# Patient Record
Sex: Male | Born: 1980 | Race: White | Hispanic: No | Marital: Single | Smoking: Current every day smoker
Health system: Southern US, Community
[De-identification: ages and names within clinical notes are randomized; demographics above are authoritative.]

## PROBLEM LIST (undated history)

## (undated) DIAGNOSIS — F1010 Alcohol abuse, uncomplicated: Secondary | ICD-10-CM

---

## 2002-07-10 ENCOUNTER — Ambulatory Visit (HOSPITAL_COMMUNITY): Admission: RE | Admit: 2002-07-10 | Discharge: 2002-07-10 | Payer: Self-pay | Admitting: Family Medicine

## 2002-07-10 ENCOUNTER — Encounter: Payer: Self-pay | Admitting: Family Medicine

## 2013-09-10 ENCOUNTER — Encounter (HOSPITAL_COMMUNITY): Payer: Self-pay | Admitting: Emergency Medicine

## 2013-09-10 ENCOUNTER — Emergency Department (HOSPITAL_COMMUNITY)
Admission: EM | Admit: 2013-09-10 | Discharge: 2013-09-10 | Disposition: A | Discharge status: Home / Self Care | Payer: No Typology Code available for payment source | Attending: Emergency Medicine | Admitting: Emergency Medicine

## 2013-09-10 DIAGNOSIS — IMO0002 Reserved for concepts with insufficient information to code with codable children: Secondary | ICD-10-CM | POA: Insufficient documentation

## 2013-09-10 DIAGNOSIS — Y929 Unspecified place or not applicable: Secondary | ICD-10-CM | POA: Insufficient documentation

## 2013-09-10 DIAGNOSIS — F17200 Nicotine dependence, unspecified, uncomplicated: Secondary | ICD-10-CM | POA: Insufficient documentation

## 2013-09-10 DIAGNOSIS — Z791 Long term (current) use of non-steroidal anti-inflammatories (NSAID): Secondary | ICD-10-CM | POA: Insufficient documentation

## 2013-09-10 DIAGNOSIS — S058X9A Other injuries of unspecified eye and orbit, initial encounter: Secondary | ICD-10-CM | POA: Insufficient documentation

## 2013-09-10 DIAGNOSIS — Y9389 Activity, other specified: Secondary | ICD-10-CM | POA: Insufficient documentation

## 2013-09-10 DIAGNOSIS — S0501XA Injury of conjunctiva and corneal abrasion without foreign body, right eye, initial encounter: Secondary | ICD-10-CM

## 2013-09-10 MED ORDER — TETRACAINE HCL 0.5 % OP SOLN
2.0000 [drp] | Freq: Once | OPHTHALMIC | Status: AC
  Administered 2013-09-10: 2 [drp] via OPHTHALMIC
  Filled 2013-09-10: qty 2

## 2013-09-10 MED ORDER — HYDROCODONE-ACETAMINOPHEN 5-325 MG PO TABS: 1.0000 | ORAL_TABLET | ORAL | Status: DC | PRN

## 2013-09-10 MED ORDER — ERYTHROMYCIN 5 MG/GM OP OINT
TOPICAL_OINTMENT | Freq: Once | OPHTHALMIC | Status: AC
  Administered 2013-09-10: 16:00:00 via OPHTHALMIC

## 2013-09-10 MED ORDER — FLUORESCEIN SODIUM 1 MG OP STRP
1.0000 | ORAL_STRIP | Freq: Once | OPHTHALMIC | Status: AC
  Administered 2013-09-10: 1 via OPHTHALMIC
  Filled 2013-09-10: qty 1

## 2013-09-10 NOTE — ED Notes (Signed)
Per pt, working with wood saw.  Feels as if wood splinter has scratched eye.

## 2013-09-10 NOTE — ED Provider Notes (Signed)
CSN: [id number]     Arrival date & time 09/10/13  1518 History   First MD Initiated Contact with Patient 09/10/13 1545     Chief Complaint  Patient presents with  . Eye Pain    HPI Comments: Pt was working with a wood saw without using eye protection.  He felt like something kicked up an hit him in the eye.  He still feels like there is a piece of debris.  Opening his eye causes him to tear more and cause more pain.  His vision is blurry with all of the tears.  Patient is a 33 y.o. male presenting with eye pain. The history is provided by the patient.  Eye Pain This is a new problem. Episode onset: a short time ago today. The problem occurs constantly. The problem has not changed since onset.Nothing aggravates the symptoms. Relieved by: light, opening eye. He has tried nothing for the symptoms.    History reviewed. No pertinent past medical history. History reviewed. No pertinent past surgical history. History reviewed. No pertinent family history. History  Substance Use Topics  . Smoking status: Current Every Day Smoker -- 0.50 packs/day    Types: Cigarettes  . Smokeless tobacco: Not on file  . Alcohol Use: Yes     Comment: social    Review of Systems  Eyes: Positive for pain.  All other systems reviewed and are negative.    Allergies  Review of patient's allergies indicates no known allergies.  Home Medications   Current Outpatient Rx  Name  Route  Sig  Dispense  Refill  . naproxen sodium (ANAPROX) 220 MG tablet   Oral   Take 2 mg by mouth once.         Marland Kitchen HYDROcodone-acetaminophen (NORCO/VICODIN) 5-325 MG per tablet   Oral   Take 1-2 tablets by mouth every 4 (four) hours as needed.   16 tablet   0    BP 138/86  Pulse 66  Temp(Src) 98.8 F (37.1 C) (Oral)  Resp 18  SpO2 100% Physical Exam  Nursing note and vitals reviewed. Constitutional: He appears well-developed and well-nourished. No distress.  HENT:  Head: Normocephalic and atraumatic.  Right Ear:  External ear normal.  Left Ear: External ear normal.  Eyes: EOM are normal. Pupils are equal, round, and reactive to light. Right eye exhibits no discharge. Left eye exhibits no discharge. Right conjunctiva is injected. Left conjunctiva is not injected. No scleral icterus.  Slit lamp exam:      The right eye shows corneal abrasion and fluorescein uptake. The right eye shows no corneal flare, no corneal ulcer, no foreign body and no anterior chamber bulge.       The left eye shows no corneal abrasion, no corneal flare, no corneal ulcer, no foreign body, no fluorescein uptake and no anterior chamber bulge.    Eyelid everted, tetracaine used for patient comfort during exam, fluoroscein uptake depicted in blue  Neck: Neck supple. No tracheal deviation present.  Cardiovascular: Normal rate.   Pulmonary/Chest: Effort normal. No stridor. No respiratory distress.  Musculoskeletal: He exhibits no edema.  Neurological: He is alert. Cranial nerve deficit: no gross deficits.  Skin: Skin is warm and dry. No rash noted.  Psychiatric: He has a normal mood and affect.    ED Course  Procedures (including critical care time) Eye irrigation 1 l ns   MDM   1. Corneal abrasion, right, initial encounter     No foreign body noted on exam.  Will irrigate eye in the ED copiously.  Follow up with ophthalmology if symptoms aren't resolved within 24 hours    Celene KrasJon R Trenita Hulme, MD 09/10/13 [phone]

## 2013-09-10 NOTE — Discharge Instructions (Signed)
Corneal Abrasion  The cornea is the clear covering at the front and center of the eye. When looking at the colored portion (iris) of the eye, you are looking through that person's cornea.   This very thin tissue is made up of many layers. The surface layer is a single layer of cells called the corneal epithelium. This is one of the most sensitive tissues in the body. If a scratch or injury causes the corneal epithelium to come off, it is called a corneal abrasion. If the injury extends to the tissues below the epithelium, the condition is called a corneal ulcer.   CAUSES   · Scratches.  · Trauma.  · Foreign body in the eye.  · Some people have recurrences of abrasions in the area of the original injury even after they heal. This is called recurrent erosion syndrome. Recurrent erosion syndromes generally improve and go away with time.  SYMPTOMS   · Eye pain.  · Difficulty or inability to keep the injured eye open.  · The eye becomes very sensitive to light.  · Recurrent erosions tend to happen suddenly, first thing in the morning  usually upon awakening and opening the eyes.  DIAGNOSIS   Your eye professional can diagnose a corneal abrasion during an eye exam. Dye is usually placed in the eye using a drop or a small paper strip moistened by the patient's tears. When the eye is examined with a special light, the abrasion shows up clearly because of the dye.  TREATMENT   · Small abrasions may be treated with antibiotic drops or ointment alone.  · Usually a pressure patch is specially applied. Pressure patches prevent the eye from blinking, allowing the corneal epithelium to heal. Because blinking is less, a pressure patch also reduces the amount of pain present in the eye during healing. Most corneal abrasions heal within 2-3 days with no effect on vision. WARNING: Do not drive or operate machinery while your eye is patched. Your ability to judge distances is impaired.  · If abrasion becomes infected and spreads to the  deeper tissues of the cornea, a corneal ulcer can result. This is serious because it can cause corneal scarring. Corneal scars interfere with light passing through the cornea, and cause a loss of vision in the involved eye.  · If your caregiver has given you a follow-up appointment, it is very important to keep that appointment. Not keeping the appointment could result in a severe eye infection or permanent loss of vision. If there is any problem keeping the appointment, you must call back to this facility for assistance.  SEEK MEDICAL CARE IF:   · You have pain, light sensitivity and a scratchy feeling in one eye (or both).  · Your pressure patch keeps loosening up and you can blink your eye under the patch after treatment.  · Any kind of discharge develops from the involved eye after treatment or if the lids stick together in the morning.  · You have the same symptoms in the morning as you did with the original abrasion days, weeks or months after the abrasion healed.  MAKE SURE YOU:   · Understand these instructions.  · Will watch your condition.  · Will get help right away if you are not doing well or get worse.  Document Released: 08/18/2000 Document Revised: 11/13/2011 Document Reviewed: 03/26/2008  [employer]® Patient Information ©2014 [employer].

## 2013-10-16 ENCOUNTER — Encounter (HOSPITAL_COMMUNITY): Payer: Self-pay | Admitting: Emergency Medicine

## 2013-10-16 ENCOUNTER — Emergency Department (HOSPITAL_COMMUNITY)
Admission: EM | Admit: 2013-10-16 | Discharge: 2013-10-16 | Disposition: A | Discharge status: Home / Self Care | Payer: No Typology Code available for payment source | Attending: Emergency Medicine | Admitting: Emergency Medicine

## 2013-10-16 ENCOUNTER — Emergency Department (HOSPITAL_COMMUNITY): Payer: No Typology Code available for payment source

## 2013-10-16 DIAGNOSIS — Y9367 Activity, basketball: Secondary | ICD-10-CM | POA: Insufficient documentation

## 2013-10-16 DIAGNOSIS — S93402A Sprain of unspecified ligament of left ankle, initial encounter: Secondary | ICD-10-CM

## 2013-10-16 DIAGNOSIS — X500XXA Overexertion from strenuous movement or load, initial encounter: Secondary | ICD-10-CM | POA: Insufficient documentation

## 2013-10-16 DIAGNOSIS — Y92838 Other recreation area as the place of occurrence of the external cause: Secondary | ICD-10-CM

## 2013-10-16 DIAGNOSIS — Y9239 Other specified sports and athletic area as the place of occurrence of the external cause: Secondary | ICD-10-CM | POA: Insufficient documentation

## 2013-10-16 DIAGNOSIS — Z791 Long term (current) use of non-steroidal anti-inflammatories (NSAID): Secondary | ICD-10-CM | POA: Insufficient documentation

## 2013-10-16 DIAGNOSIS — R269 Unspecified abnormalities of gait and mobility: Secondary | ICD-10-CM | POA: Insufficient documentation

## 2013-10-16 DIAGNOSIS — F17200 Nicotine dependence, unspecified, uncomplicated: Secondary | ICD-10-CM | POA: Insufficient documentation

## 2013-10-16 DIAGNOSIS — S93409A Sprain of unspecified ligament of unspecified ankle, initial encounter: Secondary | ICD-10-CM | POA: Insufficient documentation

## 2013-10-16 MED ORDER — OXYCODONE-ACETAMINOPHEN 5-325 MG PO TABS
2.0000 | ORAL_TABLET | Freq: Once | ORAL | Status: AC
  Administered 2013-10-16: 2 via ORAL
  Filled 2013-10-16: qty 2

## 2013-10-16 MED ORDER — OXYCODONE-ACETAMINOPHEN 5-325 MG PO TABS: 2.0000 | ORAL_TABLET | ORAL | Status: DC | PRN

## 2013-10-16 NOTE — Discharge Instructions (Signed)
Take Percocet as needed for pain. Rest, ice and elevate ankle. Refer to attached documents for more information. Return to the ED with worsening or concerning symptoms.

## 2013-10-16 NOTE — ED Provider Notes (Signed)
CSN: [id number]     Arrival date & time 10/16/13  1156 History  This chart was scribed for non-physician practitioner working with Shanna CiscoMegan E Docherty, MD by Ashley JacobsBrittany Andrews, ED scribe. This patient was seen in room WTR8/WTR8 and the patient's care was started at 12:58 PM.   First MD Initiated Contact with Patient 10/16/13 1234     Chief Complaint  Patient presents with  . Ankle Injury     (Consider location/radiation/quality/duration/timing/severity/associated sxs/prior Treatment) Patient is a 33 y.o. male presenting with lower extremity injury. The history is provided by the patient and medical records. No language interpreter was used.  Ankle Injury   HPI Comments: Regenia SkeeterChristopher J Swearengin is a 33 y.o. male who presents to the Emergency Department complaining of ankle injury after he everted his ankle while playing basketball five days ago. Pt explains when he jump he rolled his ankle upon landing.  He is experiencing progressively worsening pain with swelling under his ankle and pain that extends laterally. He states feeling a "hot line" under his ankle. Nothing seems to resolve the pain. He has tried Noroco/Vicodin 5-325 mg, naproxen and ibuprofen to no relief. Pt has bruising to his lateral heel and ankle. Deneis pain with palaption but the pain is worse with movement. Denies fall and head injury. Denies nausea and vomiting. Feels a hot line under this ankle. Tried Naproxen sodium and ibuprofen to no relief.    History reviewed. No pertinent past medical history. History reviewed. No pertinent past surgical history. No family history on file. History  Substance Use Topics  . Smoking status: Current Every Day Smoker -- 0.50 packs/day    Types: Cigarettes  . Smokeless tobacco: Not on file  . Alcohol Use: Yes     Comment: social    Review of Systems  Musculoskeletal: Positive for arthralgias, gait problem, joint swelling and myalgias.  All other systems reviewed and are  negative.      Allergies  Review of patient's allergies indicates no known allergies.  Home Medications   Current Outpatient Rx  Name  Route  Sig  Dispense  Refill  . HYDROcodone-acetaminophen (NORCO/VICODIN) 5-325 MG per tablet   Oral   Take 1-2 tablets by mouth every 4 (four) hours as needed.   16 tablet   0   . ibuprofen (ADVIL,MOTRIN) 200 MG tablet   Oral   Take 400 mg by mouth every 6 (six) hours as needed for mild pain or moderate pain.         . naproxen sodium (ANAPROX) 220 MG tablet   Oral   Take 220 mg by mouth 2 (two) times daily with a meal.           BP 123/51  Pulse 76  Temp(Src) 98.4 F (36.9 C) (Oral)  Resp 20  SpO2 99% Physical Exam  Nursing note and vitals reviewed. Constitutional: He appears well-developed and well-nourished. No distress.  HENT:  Head: Normocephalic and atraumatic.  Eyes: Conjunctivae are normal.  Cardiovascular: Normal rate, regular rhythm and intact distal pulses.  Exam reveals no gallop and no friction rub.   No murmur heard. Pulses:      Dorsalis pedis pulses are 2+ on the right side, and 2+ on the left side.  Pulmonary/Chest: Effort normal and breath sounds normal. He has no wheezes. He has no rales. He exhibits no tenderness.  Abdominal: There is no tenderness.  Musculoskeletal: Normal range of motion.   Sensation intact  Significant edema to the lateral portion of  the left foot ROM is limited due to pain Patellar and achilles tendon pulses are 2+     Neurological: He is alert.  Speech is goal-oriented. Moves limbs without ataxia.   Skin: Skin is warm and dry.  ecchymosis along the lateral side of the left heel.    ED Course  Procedures (including critical care time)  SPLINT APPLICATION Date/Time: 01/10/2013 3:38 PM Authorized by: Emilia Beck Consent: Verbal consent obtained. Risks and benefits: risks, benefits and alternatives were discussed Consent given by: patient Splint applied by: orthopedic  technician Location details: left ankle Splint type: ASO Post-procedure: The splinted body part was neurovascularly unchanged following the procedure. Patient tolerance: Patient tolerated the procedure well with no immediate complications.     DIAGNOSTIC STUDIES: Oxygen Saturation is 99% on room air, normal by my interpretation.    COORDINATION OF CARE:  1:01 PM Discussed course of care with pt . Pt understands and agrees.    Labs Review Labs Reviewed - No data to display Imaging Review No results found.  EKG Interpretation   None       MDM  I personally performed the services described in this documentation, which was scribed in my presence. The recorded information has been reviewed and is accurate.    Final diagnoses:  Left ankle sprain    Xray unremarkable for acute changes. Patient likely sprained his ankle. Patient will have ASO splint and instructions to rest, ice and elevate ankle. Patient will have Percocet for pain. No neurovascular compromise.     Emilia Beck, PA-C 10/16/13 1906

## 2013-10-16 NOTE — ED Provider Notes (Signed)
Medical screening examination/treatment/procedure(s) were performed by non-physician practitioner and as supervising physician I was immediately available for consultation/collaboration.   Joene Gelder E Babbie Dondlinger, MD 10/16/13 1912 

## 2013-10-16 NOTE — ED Notes (Signed)
Pt states turned his ankle on Sunday night; increased swelling/pain/bruising since then

## 2015-04-21 IMAGING — CR DG ANKLE COMPLETE 3+V*L*
3 series · 3 of 3 positions shown · non-contrast
Comparison: None.

CLINICAL DATA: Pain status post trauma

EXAM:
LEFT ANKLE COMPLETE - 3+ VIEW

[x ankle ap left]
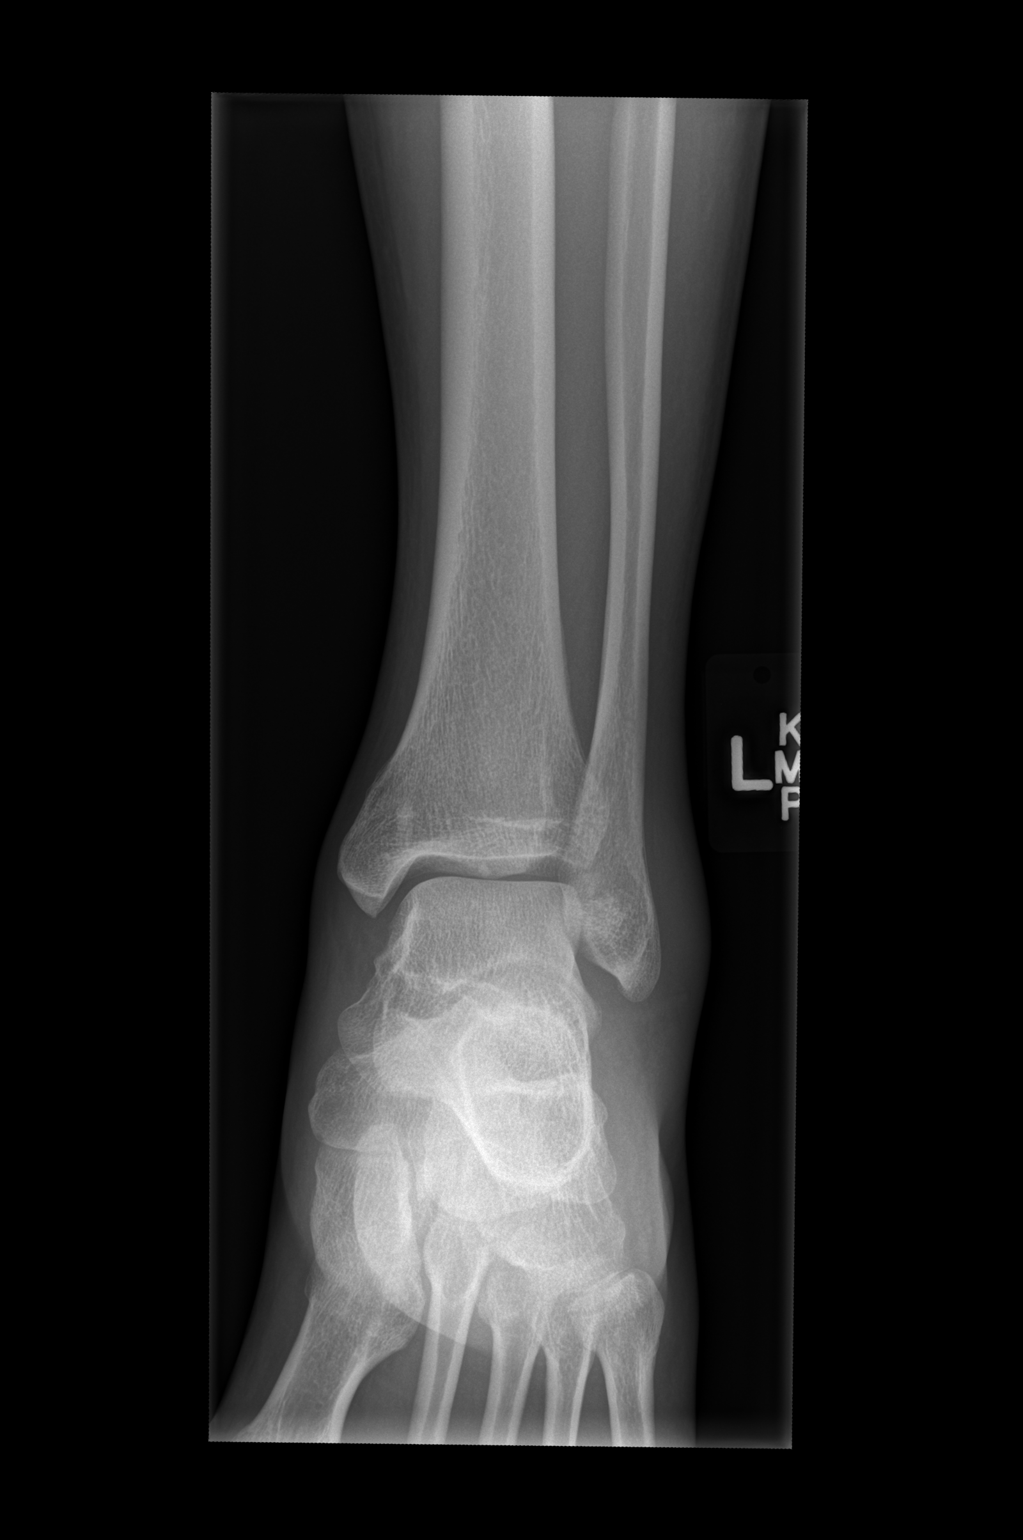

[x ankle obl left]
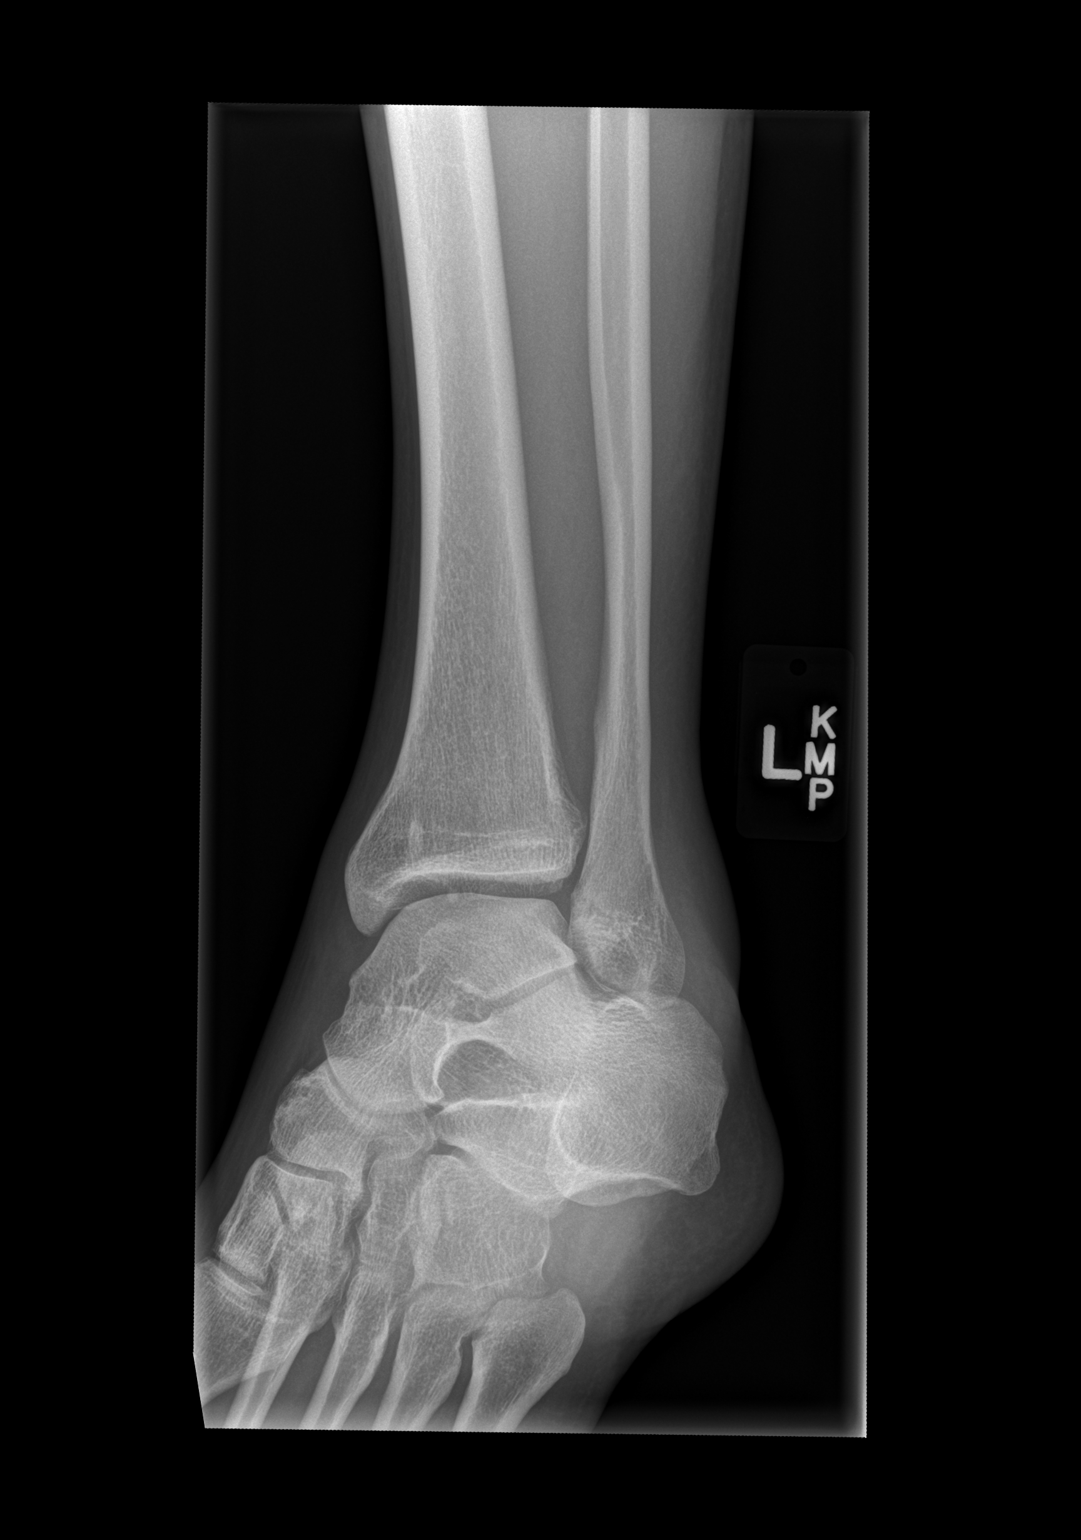

[x ankle lat left]
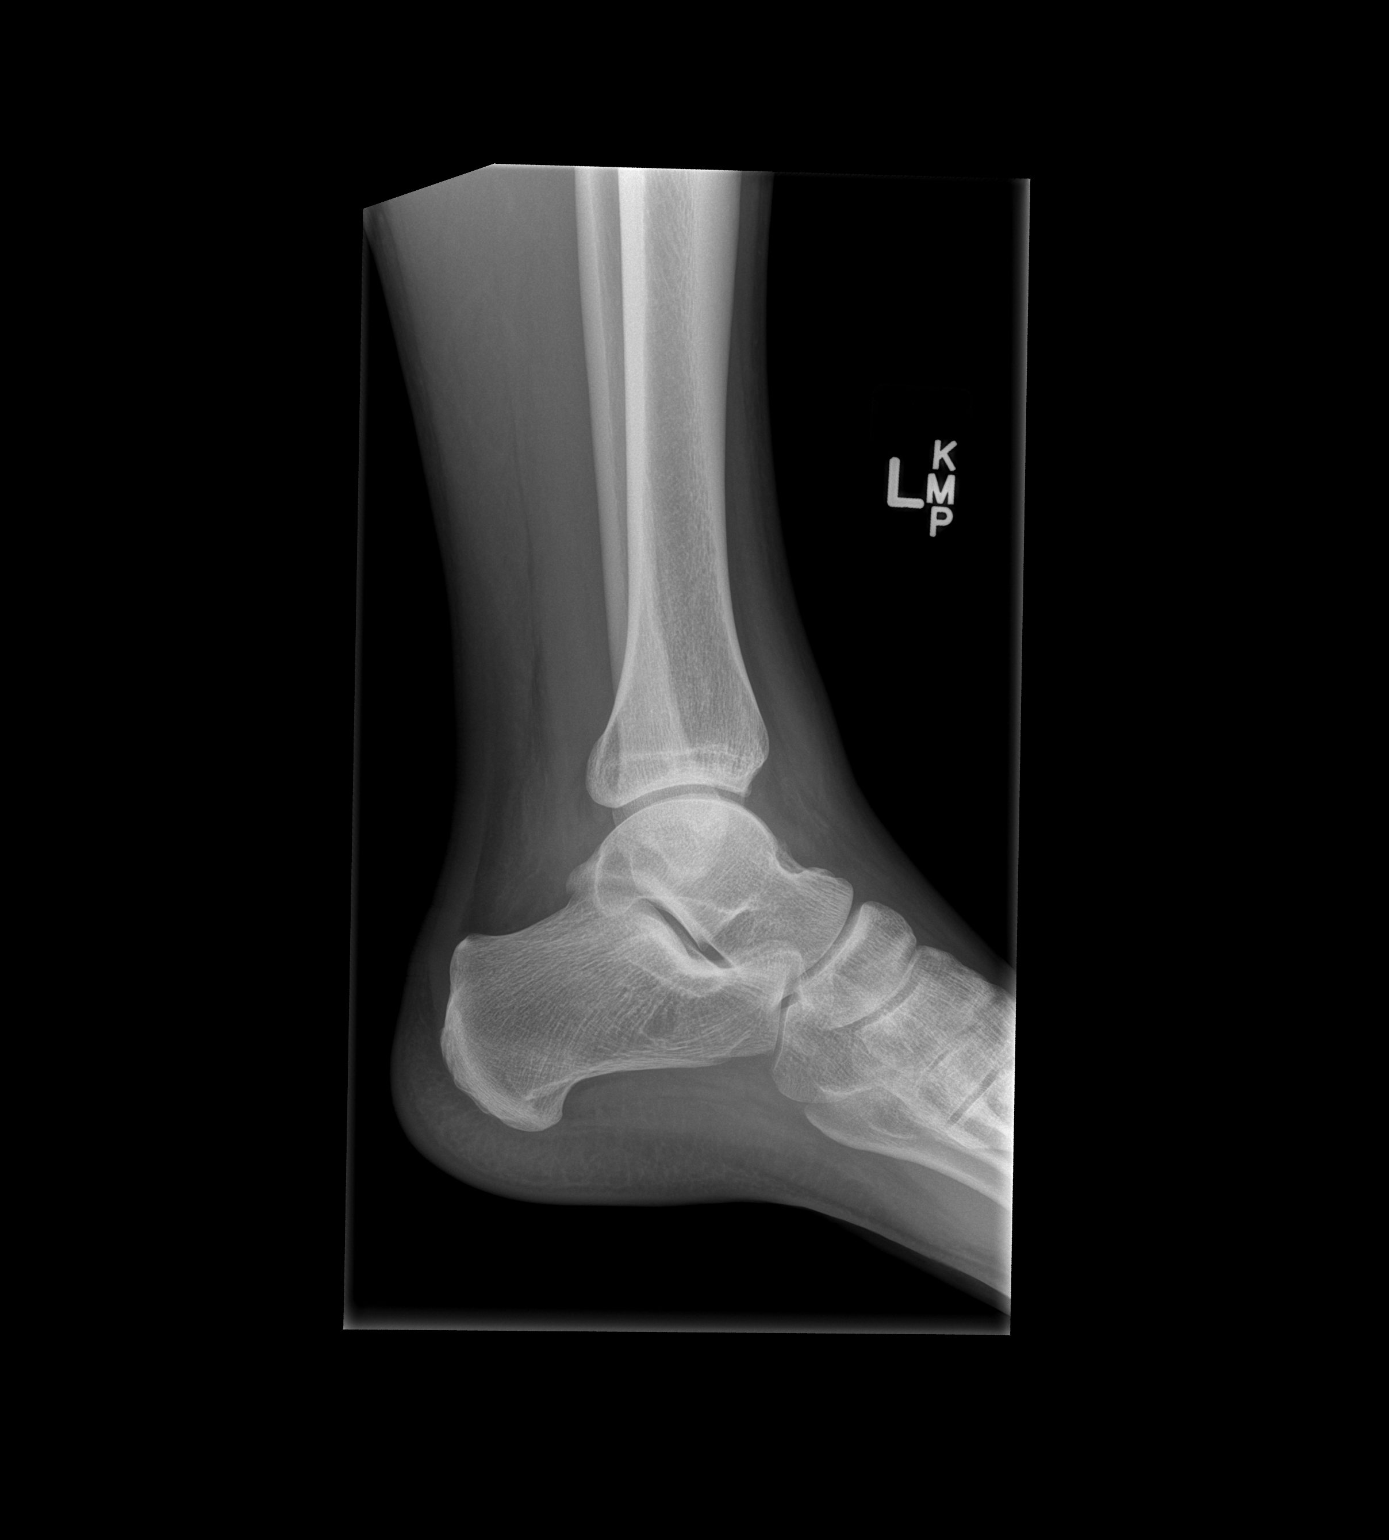

[3 of 3 positions shown; findings below may reference images not displayed]

FINDINGS: There is no evidence of fracture, dislocation, or joint effusion.
There is no evidence of arthropathy or other focal bone abnormality.
Soft tissue swelling is appreciated within the lateral malleolar
region.
IMPRESSION: No evidence of acute osseous abnormalities. Findings which may
reflect soft tissue injury in the lateral malleolar region.

## 2024-04-12 NOTE — ED Provider Notes (Signed)
 HISTORY OF PRESENT ILLNESS  ED Provider First Contact Date and Time: 04/12/2024 12:46 AM No att. providers found  History Provided by: patient and police  Chief Complaint: Right pinky toe injury   History of Present Illness Vincent Wallace is a 43 year old male who presents with a toe injury.  He sustained a toe injury just before arriving at the emergency department. His pinky toe was unintentionally slammed in the police car door, resulting in a small wound to the volar aspect of his fifth digit, which he describes as minor.  He is reluctant to receive any injections/stitches and prefers to avoid needles. The patient believes his tetanus vaccination is up to date.   PAST HISTORY   Past Medical/Surgical History:   Past Medical History[1]  Past Surgical History[2]  Social History:  Social History   Tobacco Use  . Smoking status: Not on file  . Smokeless tobacco: Not on file  Substance Use Topics  . Alcohol use: Not on file    Family History: family history is not on file.   Medications: The patient's home medications section has been reviewed.  Allergies: has no known allergies.  PHYSICAL EXAM   Vital Signs: Reviewed the patient's vital signs.  Blood pressure (!) 142/92, pulse 55, temperature 98.3 F (36.8 C), resp. rate 18, SpO2 99%.    Pulse oximetry interpretation: Not hypoxic   Physical Exam GENERAL: Alert, cooperative, well developed, no acute distress. HEENT: Normocephalic, normal oropharynx, moist mucous membranes EXTREMITIES: No cyanosis or edema. One centimeter superficial laceration on flexor surface where right fifth toe meets foot, does not appear to be very deep, mild tenderness over fifth digit without deformity, no foot tenderness NEUROLOGICAL: Cranial nerves grossly intact, moves all extremities without gross motor or sensory deficit.  RESULTS    Labs Reviewed - No data to display  Radiology Studies:   No results found.   Results   ED COURSE   ED Course as of 04/12/24 0351  Sat Apr 12, 2024  0055 Patient states tetanus up-to-date, cannot confirm.  Refusing tetanus booster [CJ]    ED Course User Index [CJ] Courtney Jones, MD    Medications Ordered Today  Medications  . bacitracin zinc ointment    EKG: Reviewed by myself, see ED chart and official read in [facility]  RADIOLOGY INTERPRETATION: I independently reviewed the radiology: study.  All labs interpreted by No att. providers found                          Glasgow Coma Scale Eye Opening: Spontaneous Best Verbal Response: Oriented Best Motor Response: Obeys commands Glasgow Coma Scale Score: 15     MEDICAL DECISION MAKING   Medical Decision Making Patient is a 43 year old male who is in police custody, came here for medical clearance after his foot was closed in a police car door.  He has sustained a small superficial laceration to his right foot where the fifth digit meets the ball of his foot, patient declined sutures.  Wound was irrigated and antibiotic cream placed in a bandage.  He does have tenderness over the fifth digit but declines x-ray imaging.  Declines tetanus.  Patient is stable for discharge with police.  Problems Addressed: Laceration of lesser toe of right foot without foreign body present, nail damage status unspecified, initial encounter: acute illness or injury Toe pain, right: acute illness or injury     Assessment & Plan Acute traumatic open wound of  the pinky toe   The wound is about one centimeter and shallow, with no signs of fracture. Consider an x-ray to rule out bone injury. He prefers minimal intervention due to aversion to needles. Clean the wound and apply antibiotic ointment. Buddy tape the toe if necessary.    Differential Diagnoses: Differentials include, but are not limited to: Laceration, fracture  Additional plans include:  No orders of the defined types were placed in this  encounter.     IMPRESSION AND DISPOSITION    CLINICAL IMPRESSION  Laceration of lesser toe of right foot without foreign body present, nail damage status unspecified, initial encounter  Toe pain, right  Disposition: Discharge Patient condition: Stable  COUNSELING   Counseling: The emergency provider has spoken with the patient and police and discussed today's findings, in addition to providing specific details for the plan of care.  Questions are answered and there is agreement with the plan.  Counseling was provided regarding the diagnosis and prognosis. and Discussed the specific conditions for return, as well as the importance of follow-up.          [1] No past medical history on file. [2] No past surgical history on file.  Charmaine Molt, MD 04/12/24 [phone]

## 2024-04-14 ENCOUNTER — Ambulatory Visit (HOSPITAL_COMMUNITY)
Admission: EM | Admit: 2024-04-14 | Discharge: 2024-04-14 | Disposition: A | Discharge status: Home / Self Care | Payer: Self-pay

## 2024-04-14 DIAGNOSIS — F1090 Alcohol use, unspecified, uncomplicated: Secondary | ICD-10-CM

## 2024-04-14 NOTE — Progress Notes (Signed)
   04/14/24 1439  [facility] Triage Screening (Walk-ins at [facility] only)  What Is the Reason for Your Visit/Call Today? 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 He says he has been prescribed this medication in the past and would like to get started today. He denies using any alcohol or substance use within the past 24 hours. He denies depressive symptoms. He denies current suicidal ideation, homicidal ideation or psychotic symptoms.  How Long Has This Been Causing You Problems? > than 6 months  Have You Recently Had Any Thoughts About Hurting Yourself? No  Are You Planning to Commit Suicide/Harm Yourself At This time? No  Have you Recently Had Thoughts About Hurting Someone Sherral? No  Are You Planning To Harm Someone At This Time? No  Physical Abuse Denies  Verbal Abuse Denies  Sexual Abuse Denies  Exploitation of patient/patient's resources Denies  Self-Neglect Denies  Possible abuse reported to: Other (Comment) (NA)  Are you currently experiencing any auditory, visual or other hallucinations? No  Have You Used Any Alcohol or Drugs in the Past 24 Hours? No  Do you have any current medical co-morbidities that require immediate attention? No  Clinician description of patient physical appearance/behavior: Pt is casually dressed and wearing eyeglasses. He alert and oriented x4. Pt speaks in a clear tone, at moderate volume and normal pace. Motor behavior appears normal. Eye contact is good. Pt's mood is euthymic and affect is congruent with mood. Thought process is coherent and relevant. There is no indication he is currently responding to internal stimuli or experiencing delusional thought content. He is pleasant and cooperative.  What Do You Feel Would Help You the Most Today? Alcohol or Drug Use Treatment;Medication(s)  If access to [facility] was not available, would you have sought care in the Emergency Department? No  Determination of Need Routine (7 days)  Options For Referral Medication  Management

## 2024-04-14 NOTE — ED Provider Notes (Signed)
 [facility]al [facility] Medical Screening Exam  Patient Name: Vincent Wallace MRN: [id number] Date of Evaluation: 04/14/24 Chief Complaint: 'I want to get restarted on my Antabuse  Diagnosis:  Final diagnoses:  Alcohol use disorder    History of Present Illness: Vincent Wallace is a 43 y.o. male with a history significant for alcohol use disorder who presented voluntarily to [facility] with a chief complaint of I want to get restarted on my Antabuse. Patient seen face to face by this provider, consulted with Dr. Cole; and chart reviewed on 04/14/24. Patient reports he was previously prescribed Antabuse 250 mg and found it helpful towards maintaining his sobriety, having it was like a safety blanket. He reports that he has not taken medication in over two weeks due to difficulty with provider's availability and scheduling an appointment. Patient reports that his last drink was 6 days ago. Reports he was previously drinking daily and slowly tapered down his use over the course of a couple of months. He reports during his heaviest use he was consuming about 6 beers per day. He denies current withdrawal symptoms and denies a history of complicated withdrawals (seizures, delirium tremens). He reports previous inpatient and outpatient substance use treatment, which he has found helpful. He reports daily cigarette use, about 1 pack per day. He denies use of other substances. He reports his mood has been pretty good recently and denies depressive or anxiety symptoms. Reports he has been sleeping and eating fairly well. He denies of history of mental health conditions. He reports he has a great support system, his best friend that lives closely, AA group, and therapist.   During evaluation Vincent Wallace is sitting in no acute distress. He is alert/oriented x 4; calm/cooperative; and mood congruent with affect.He is speaking in a clear tone at moderate volume, and normal pace; with good  eye contact. His thought process is coherent and relevant; There is no indication that he is currently responding to internal/external stimuli or experiencing delusional thought content; and he has denied suicidal/self-harm/homicidal ideation, psychosis, and paranoia.Patient has remained calm throughout assessment and has answered questions appropriately.    At this time Vincent Wallace is educated and verbalizes understanding of mental health resources and other crisis services in the community. He is instructed to call 911 and present to the nearest emergency room should he experience any suicidal/homicidal ideation, auditory/visual/hallucinations, or detrimental worsening of his mental health condition.   Psychiatric Specialty Exam  Presentation  General Appearance:Appropriate for Environment  Eye Contact:Good  Speech:Clear and Coherent  Speech Volume:Normal   Mood and Affect  Mood:Euthymic  Affect:Appropriate   Thought Process  Thought Processes:Coherent  Descriptions of Associations:Intact  Orientation:Full (Time, Place and Person)  Thought Content:WDL    Hallucinations:None  Ideas of Reference:None  Suicidal Thoughts:No  Homicidal Thoughts:No   Sensorium  Memory:Immediate Good; Recent Good  Judgment:Fair  Insight:Good   Executive Functions  Concentration:Fair  Attention Span:Fair  Recall:Fair  Fund of Knowledge:Fair  Language:Fair   Psychomotor Activity  Psychomotor Activity:Normal   Assets  Assets:Communication Skills; Desire for Improvement; Social Support   Sleep  Sleep:Good  Number of hours: 7   Physical Exam: Physical Exam Cardiovascular:     Rate and Rhythm: Normal rate.  Musculoskeletal:        General: Normal range of motion.  Neurological:     Mental Status: He is alert.  Psychiatric:        Mood and Affect: Mood normal.  Speech: Speech normal.    Review of Systems  Constitutional: Negative.   HENT:  Negative.    Musculoskeletal: Negative.    Temp: 98.2 F HR: 76 bpm, O2: 99%, RR: 16, BP: 155/113  Musculoskeletal: Strength & Muscle Tone: within normal limits Gait & Station: normal Patient leans: N/A   [facility] MSE Discharge Disposition for Follow up and Recommendations: Based on my evaluation the patient does not appear to have an emergency medical condition and can be discharged with resources and follow up care in outpatient services for Medication Management.   Patient presents with a desire to be restarted on medication assisted treatment for alcohol use disorder. Patient presents with good insight, protective factors, motivation for treatment and to maintain his sobriety. At this time, patient does not meet criteria for inpatient substance use treatment. Provided patient with resources and encouraged follow up. Patient also presented with elevated BP this visit. He reports this is related to white coat syndrome. Patient is asymptomatic. Writer discussed warning signs to monitor for, present to ED with concerns and follow up with PCP. Patient verbalized agreement and understanding of information presented.    Rolin DELENA Lipps, NP 04/14/2024, 5:53 PM

## 2024-04-14 NOTE — Discharge Instructions (Addendum)
 Past    Discharge recommendations:   Outpatient Follow up: Please review list of outpatient resources for psychiatry and counseling. Please follow up with your primary care provider for all medical related needs.   Therapy: We recommend that patient participate in individual therapy to address mental health concerns.  Safety:   The following safety precautions should be taken:   No sharp objects. This includes scissors, razors, scrapers, and putty knives.   Chemicals should be removed and locked up.   Medications should be removed and locked up.   Weapons should be removed and locked up. This includes firearms, knives and instruments that can be used to cause injury.   The patient should abstain from use of illicit substances/drugs and abuse of any medications.  If symptoms worsen or do not continue to improve or if the patient becomes actively suicidal or homicidal then it is recommended that the patient return to the closest hospital emergency department, the [facility], or call 911 for further evaluation and treatment. National Suicide Prevention Lifeline 1-800-SUICIDE or 515-485-9306.  About 988 988 offers 24/7 access to trained crisis counselors who can help people experiencing mental health-related distress. People can call or text 988 or chat 988lifeline.org for themselves or if they are worried about a loved one who may need crisis support.

## 2024-04-15 ENCOUNTER — Encounter (HOSPITAL_COMMUNITY): Payer: Self-pay | Admitting: *Deleted

## 2024-04-15 ENCOUNTER — Ambulatory Visit (HOSPITAL_COMMUNITY)
Admission: EM | Admit: 2024-04-15 | Discharge: 2024-04-15 | Disposition: A | Discharge status: Home / Self Care | Payer: PRIVATE HEALTH INSURANCE

## 2024-04-15 DIAGNOSIS — Z760 Encounter for issue of repeat prescription: Secondary | ICD-10-CM | POA: Diagnosis not present

## 2024-04-15 DIAGNOSIS — F1020 Alcohol dependence, uncomplicated: Secondary | ICD-10-CM

## 2024-04-15 HISTORY — DX: Alcohol abuse, uncomplicated: F10.10

## 2024-04-15 MED ORDER — BACLOFEN 10 MG PO TABS: 10.0000 mg | ORAL_TABLET | Freq: Three times a day (TID) | ORAL | 0 refills | Status: AC

## 2024-04-15 NOTE — Discharge Instructions (Signed)
 Discussed starting baclofen  for alcohol dependency will give a 10-day supply only You will need to follow-up with [facility] as you have scheduled.  If you begin to have chest pain or shortness of breath increased heart rate you will need to be seen in the emergency room Do not take this medication with Ambien

## 2024-04-15 NOTE — ED Provider Notes (Signed)
 [facility]    CSN: [id number] Arrival date & time: 04/15/24  9182      History   Chief Complaint Chief Complaint  Patient presents with   Alcohol Problem    HPI Vincent Wallace is a 43 y.o. male.   Patient presents today with family member.  Patient was seen at [facility] mental [facility] yesterday for alcohol dependency.  Patient was sent here to get a prescription for baclofen .  He also was seen by [facility] counseling but was not able to get an appointment with the provider to start on medication.  They said patient to urgent care to start on medication until his appointment with the medical provider.  In the past patient was on Antabuse but that has been several months ago.  Patient has been alcohol free for 7 days denies any chest pain no shortness of breath no seizure-like activity.    Past Medical History:  Diagnosis Date   Alcohol abuse     There are no active problems to display for this patient.   History reviewed. No pertinent surgical history.     Home Medications    Prior to Admission medications   Medication Sig Start Date End Date Taking? Authorizing Provider  albuterol (VENTOLIN HFA) 108 (90 Base) MCG/ACT inhaler SMARTSIG:1-2 Puff(s) Via Inhaler PRN 11/26/23  Yes [provider]  baclofen  (LIORESAL ) 10 MG tablet Take 1 tablet (10 mg total) by mouth 3 (three) times daily. 04/15/24  Yes Merilee Andrea CROME, NP  zolpidem (AMBIEN) 10 MG tablet Take 10 mg by mouth at bedtime. 02/26/24  Yes [provider]  HYDROcodone -acetaminophen  (NORCO/VICODIN) 5-325 MG per tablet Take 1-2 tablets by mouth every 4 (four) hours as needed. 09/10/13   Randol Simmonds, MD  ibuprofen (ADVIL,MOTRIN) 200 MG tablet Take 400 mg by mouth every 6 (six) hours as needed for mild pain or moderate pain.    [provider]  naproxen sodium (ANAPROX) 220 MG tablet Take 220 mg by mouth 2 (two) times daily with a meal.     [provider]  oxyCODONE -acetaminophen   (PERCOCET/ROXICET) 5-325 MG per tablet Take 2 tablets by mouth every 4 (four) hours as needed for severe pain. 10/16/13   Szekalski, Kaitlyn, PA-C    Family History History reviewed. No pertinent family history.  Social History Social History   Tobacco Use   Smoking status: Every Day    Current packs/day: 0.50    Types: Cigarettes   Smokeless tobacco: Never  Vaping Use   Vaping status: Never Used  Substance Use Topics   Alcohol use: Yes    Comment: states no alcohol in 7 days...SABRASABRA8/08/2024   Drug use: No     Allergies   Patient has no known allergies.   Review of Systems Review of Systems  Constitutional:  Negative for chills, fatigue and fever.  HENT: Negative.    Eyes: Negative.   Respiratory: Negative.    Cardiovascular: Negative.   Gastrointestinal: Negative.   Genitourinary: Negative.   Skin: Negative.   Neurological: Negative.  Negative for dizziness, tremors, seizures, weakness, numbness and headaches.     Physical Exam Triage Vital Signs ED Triage Vitals  Encounter Vitals Group     BP 04/15/24 0940 (!) 148/97     Girls Systolic BP Percentile --      Girls Diastolic BP Percentile --      Boys Systolic BP Percentile --      Boys Diastolic BP Percentile --  Pulse Rate 04/15/24 0940 83     Resp 04/15/24 0940 18     Temp 04/15/24 0940 98 F (36.7 C)     Temp Source 04/15/24 0940 Oral     SpO2 04/15/24 0940 98 %     Weight --      Height --      Head Circumference --      Peak Flow --      Pain Score 04/15/24 0935 0     Pain Loc --      Pain Education --      Exclude from Growth Chart --    No data found.  Updated Vital Signs BP (!) 148/97 (BP Location: Left Arm)   Pulse 83   Temp 98 F (36.7 C) (Oral)   Resp 18   SpO2 98%   Visual Acuity Right Eye Distance:   Left Eye Distance:   Bilateral Distance:    Right Eye Near:   Left Eye Near:    Bilateral Near:     Physical Exam Constitutional:      Appearance: Normal appearance.   Cardiovascular:     Rate and Rhythm: Normal rate.     Pulses: Normal pulses.     Heart sounds: Normal heart sounds.  Pulmonary:     Effort: Pulmonary effort is normal.  Abdominal:     General: Abdomen is flat.  Musculoskeletal:        General: Normal range of motion.  Skin:    General: Skin is warm.     Capillary Refill: Capillary refill takes less than 2 seconds.  Neurological:     General: No focal deficit present.     Mental Status: He is alert.      UC Treatments / Results  Labs (all labs ordered are listed, but only abnormal results are displayed) Labs Reviewed - No data to display  EKG   Radiology No results found.  Procedures Procedures (including critical care time)  Medications Ordered in UC Medications - No data to display  Initial Impression / Assessment and Plan / UC Course  I have reviewed the triage vital signs and the nursing notes.  Pertinent labs & imaging results that were available during my care of the patient were reviewed by me and considered in my medical decision making (see chart for details).     Discussed with patient we will supply a 10-day of baclofen  to help with alcohol dependency.  Patient will need to follow-up with [facility] for any further doses.  Avoid taking Ambien with this medication as it can cause dizziness and drowsy Discussed with patient and family member signs of withdrawal and that patient will need to be seen in the emergency room if any of these are noted Patient is alert clear speech no signs of tremors or seizure-like activity.  Understands the plan of care Does have an appointment on August 20 for follow-up that he will keep  Final Clinical Impressions(s) / UC Diagnoses   Final diagnoses:  Uncomplicated alcohol dependence (HCC)     Discharge Instructions      Discussed starting baclofen  for alcohol dependency will give a 10-day supply only You will need to follow-up with [facility] as you have scheduled.  If  you begin to have chest pain or shortness of breath increased heart rate you will need to be seen in the emergency room Do not take this medication with Ambien     ED Prescriptions     Medication  Sig Dispense Auth. Provider   baclofen  (LIORESAL ) 10 MG tablet Take 1 tablet (10 mg total) by mouth 3 (three) times daily. 30 each Merilee Andrea CROME, NP      PDMP not reviewed this encounter.   Merilee Andrea CROME, NP 04/15/24 1020

## 2024-04-15 NOTE — ED Triage Notes (Signed)
 Pt states that he has had no alcohol in 7 days, he is doing psy care at [facility], he was seen at [facility] urgent care. He is doing AA by zoom and in person.   He wants refill of meds Antabuse 250mg , and he states that he was told about getting baclofen  yesterday at [facility] but they did not prescribe any meds. They advised him to follow up with some resources but he states that those places are geared towards drug use not alcohol.   He states he has not seen med management at [facility] but he called. No appt scheduled.   He then states he needs a refill of ambien he took the last half yesterday since he has been taking half dose not on a regular basis.   He is here today with his neighbors wife

## 2024-09-20 ENCOUNTER — Inpatient Hospital Stay (HOSPITAL_COMMUNITY)
Admission: AD | Admit: 2024-09-20 | Discharge: 2024-09-24 | DRG: 897 | Disposition: A | Discharge status: Home / Self Care | Source: Other Acute Inpatient Hospital

## 2024-09-20 DIAGNOSIS — F419 Anxiety disorder, unspecified: Secondary | ICD-10-CM | POA: Diagnosis not present

## 2024-09-20 DIAGNOSIS — Z79899 Other long term (current) drug therapy: Secondary | ICD-10-CM | POA: Diagnosis not present

## 2024-09-20 DIAGNOSIS — F909 Attention-deficit hyperactivity disorder, unspecified type: Secondary | ICD-10-CM | POA: Diagnosis present

## 2024-09-20 DIAGNOSIS — F1490 Cocaine use, unspecified, uncomplicated: Secondary | ICD-10-CM | POA: Diagnosis present

## 2024-09-20 DIAGNOSIS — F332 Major depressive disorder, recurrent severe without psychotic features: Secondary | ICD-10-CM | POA: Diagnosis present

## 2024-09-20 DIAGNOSIS — F1524 Other stimulant dependence with stimulant-induced mood disorder: Secondary | ICD-10-CM | POA: Diagnosis present

## 2024-09-20 DIAGNOSIS — Z9151 Personal history of suicidal behavior: Secondary | ICD-10-CM

## 2024-09-20 DIAGNOSIS — F40240 Claustrophobia: Secondary | ICD-10-CM | POA: Diagnosis present

## 2024-09-20 DIAGNOSIS — F17293 Nicotine dependence, other tobacco product, with withdrawal: Secondary | ICD-10-CM | POA: Diagnosis present

## 2024-09-20 DIAGNOSIS — F1020 Alcohol dependence, uncomplicated: Secondary | ICD-10-CM | POA: Diagnosis not present

## 2024-09-20 DIAGNOSIS — R45851 Suicidal ideations: Secondary | ICD-10-CM | POA: Diagnosis present

## 2024-09-20 DIAGNOSIS — F1994 Other psychoactive substance use, unspecified with psychoactive substance-induced mood disorder: Principal | ICD-10-CM | POA: Diagnosis present

## 2024-09-20 DIAGNOSIS — Z765 Malingerer [conscious simulation]: Secondary | ICD-10-CM

## 2024-09-20 DIAGNOSIS — Z9152 Personal history of nonsuicidal self-harm: Secondary | ICD-10-CM | POA: Diagnosis not present

## 2024-09-20 DIAGNOSIS — M5430 Sciatica, unspecified side: Secondary | ICD-10-CM | POA: Diagnosis present

## 2024-09-20 DIAGNOSIS — F1290 Cannabis use, unspecified, uncomplicated: Secondary | ICD-10-CM | POA: Diagnosis present

## 2024-09-20 DIAGNOSIS — F10239 Alcohol dependence with withdrawal, unspecified: Secondary | ICD-10-CM | POA: Diagnosis present

## 2024-09-20 DIAGNOSIS — F1590 Other stimulant use, unspecified, uncomplicated: Secondary | ICD-10-CM | POA: Diagnosis not present

## 2024-09-20 MED ORDER — VITAMIN B-1 100 MG PO TABS
100.0000 mg | ORAL_TABLET | Freq: Every day | ORAL | Status: DC
  Administered 2024-09-21 – 2024-09-22 (×2): 100 mg via ORAL
  Filled 2024-09-20 (×2): qty 1

## 2024-09-20 MED ORDER — HALOPERIDOL LACTATE 5 MG/ML IJ SOLN: 10.0000 mg | Freq: Three times a day (TID) | INTRAMUSCULAR | Status: DC | PRN

## 2024-09-20 MED ORDER — CHLORDIAZEPOXIDE HCL 25 MG PO CAPS: 25.0000 mg | ORAL_CAPSULE | Freq: Four times a day (QID) | ORAL | Status: AC | PRN

## 2024-09-20 MED ORDER — HYDROXYZINE HCL 25 MG PO TABS
25.0000 mg | ORAL_TABLET | Freq: Four times a day (QID) | ORAL | Status: AC | PRN
  Administered 2024-09-20 – 2024-09-23 (×5): 25 mg via ORAL
  Filled 2024-09-20 (×5): qty 1
  Filled 2024-09-20: qty 10

## 2024-09-20 MED ORDER — LORAZEPAM 2 MG/ML IJ SOLN: 2.0000 mg | Freq: Three times a day (TID) | INTRAMUSCULAR | Status: DC | PRN

## 2024-09-20 MED ORDER — ADULT MULTIVITAMIN W/MINERALS CH
1.0000 | ORAL_TABLET | Freq: Every day | ORAL | Status: DC
  Administered 2024-09-21 – 2024-09-22 (×2): 1 via ORAL
  Filled 2024-09-20 (×2): qty 1

## 2024-09-20 MED ORDER — DIPHENHYDRAMINE HCL 50 MG/ML IJ SOLN: 50.0000 mg | Freq: Three times a day (TID) | INTRAMUSCULAR | Status: DC | PRN

## 2024-09-20 MED ORDER — ONDANSETRON 4 MG PO TBDP: 4.0000 mg | ORAL_TABLET | Freq: Four times a day (QID) | ORAL | Status: AC | PRN

## 2024-09-20 MED ORDER — CHLORDIAZEPOXIDE HCL 25 MG PO CAPS: 25.0000 mg | ORAL_CAPSULE | ORAL | Status: DC

## 2024-09-20 MED ORDER — ALUM & MAG HYDROXIDE-SIMETH 200-200-20 MG/5ML PO SUSP
30.0000 mL | ORAL | Status: DC | PRN
  Administered 2024-09-24: 30 mL via ORAL
  Filled 2024-09-20: qty 30

## 2024-09-20 MED ORDER — SERTRALINE HCL 50 MG PO TABS
50.0000 mg | ORAL_TABLET | Freq: Every day | ORAL | Status: DC
  Administered 2024-09-21 – 2024-09-24 (×4): 50 mg via ORAL
  Filled 2024-09-20 (×2): qty 1
  Filled 2024-09-20: qty 7
  Filled 2024-09-20 (×2): qty 1

## 2024-09-20 MED ORDER — CHLORDIAZEPOXIDE HCL 25 MG PO CAPS: 25.0000 mg | ORAL_CAPSULE | Freq: Every day | ORAL | Status: DC

## 2024-09-20 MED ORDER — TRAZODONE HCL 50 MG PO TABS
50.0000 mg | ORAL_TABLET | Freq: Every evening | ORAL | Status: DC | PRN
  Administered 2024-09-20 – 2024-09-21 (×2): 50 mg via ORAL
  Filled 2024-09-20 (×2): qty 1

## 2024-09-20 MED ORDER — LOPERAMIDE HCL 2 MG PO CAPS: 2.0000 mg | ORAL_CAPSULE | ORAL | Status: DC | PRN

## 2024-09-20 MED ORDER — CHLORDIAZEPOXIDE HCL 25 MG PO CAPS
25.0000 mg | ORAL_CAPSULE | Freq: Four times a day (QID) | ORAL | Status: DC
  Administered 2024-09-20 – 2024-09-21 (×2): 25 mg via ORAL
  Filled 2024-09-20 (×2): qty 1

## 2024-09-20 MED ORDER — ACETAMINOPHEN 325 MG PO TABS
650.0000 mg | ORAL_TABLET | Freq: Four times a day (QID) | ORAL | Status: DC | PRN
  Administered 2024-09-21: 650 mg via ORAL
  Filled 2024-09-20: qty 2

## 2024-09-20 MED ORDER — CHLORDIAZEPOXIDE HCL 25 MG PO CAPS: 25.0000 mg | ORAL_CAPSULE | Freq: Three times a day (TID) | ORAL | Status: DC

## 2024-09-20 MED ORDER — DIPHENHYDRAMINE HCL 25 MG PO CAPS: 50.0000 mg | ORAL_CAPSULE | Freq: Three times a day (TID) | ORAL | Status: DC | PRN

## 2024-09-20 MED ORDER — MAGNESIUM HYDROXIDE 400 MG/5ML PO SUSP
30.0000 mL | Freq: Every day | ORAL | Status: DC | PRN
  Administered 2024-09-22: 30 mL via ORAL
  Filled 2024-09-20: qty 30

## 2024-09-20 MED ORDER — HALOPERIDOL 5 MG PO TABS: 5.0000 mg | ORAL_TABLET | Freq: Three times a day (TID) | ORAL | Status: DC | PRN

## 2024-09-20 MED ORDER — HALOPERIDOL LACTATE 5 MG/ML IJ SOLN: 5.0000 mg | Freq: Three times a day (TID) | INTRAMUSCULAR | Status: DC | PRN

## 2024-09-21 ENCOUNTER — Other Ambulatory Visit: Payer: Self-pay

## 2024-09-21 ENCOUNTER — Encounter (HOSPITAL_COMMUNITY): Payer: Self-pay | Admitting: Psychiatry

## 2024-09-21 MED ORDER — TRAZODONE HCL 50 MG PO TABS
50.0000 mg | ORAL_TABLET | Freq: Once | ORAL | Status: AC
  Administered 2024-09-21: 50 mg via ORAL
  Filled 2024-09-21: qty 1

## 2024-09-21 MED ORDER — CHLORDIAZEPOXIDE HCL 25 MG PO CAPS
25.0000 mg | ORAL_CAPSULE | Freq: Four times a day (QID) | ORAL | Status: AC
  Administered 2024-09-21 (×3): 25 mg via ORAL
  Filled 2024-09-21 (×3): qty 1

## 2024-09-21 MED ORDER — CALCIUM CARBONATE ANTACID 500 MG PO CHEW
1.0000 | CHEWABLE_TABLET | Freq: Three times a day (TID) | ORAL | Status: DC
  Administered 2024-09-21 – 2024-09-24 (×8): 200 mg via ORAL
  Filled 2024-09-21 (×8): qty 1

## 2024-09-21 MED ORDER — CHLORDIAZEPOXIDE HCL 25 MG PO CAPS
25.0000 mg | ORAL_CAPSULE | Freq: Three times a day (TID) | ORAL | Status: AC
  Administered 2024-09-22 (×3): 25 mg via ORAL
  Filled 2024-09-21 (×3): qty 1

## 2024-09-21 MED ORDER — CHLORDIAZEPOXIDE HCL 25 MG PO CAPS
25.0000 mg | ORAL_CAPSULE | Freq: Every day | ORAL | Status: AC
  Administered 2024-09-24: 25 mg via ORAL
  Filled 2024-09-21: qty 1

## 2024-09-21 MED ORDER — NICOTINE 21 MG/24HR TD PT24
21.0000 mg | MEDICATED_PATCH | Freq: Every day | TRANSDERMAL | Status: DC
  Administered 2024-09-21 – 2024-09-24 (×4): 21 mg via TRANSDERMAL
  Filled 2024-09-21 (×4): qty 1

## 2024-09-21 MED ORDER — CHLORDIAZEPOXIDE HCL 25 MG PO CAPS
25.0000 mg | ORAL_CAPSULE | ORAL | Status: AC
  Administered 2024-09-23 (×2): 25 mg via ORAL
  Filled 2024-09-21 (×2): qty 1

## 2024-09-21 MED ORDER — ALBUTEROL SULFATE HFA 108 (90 BASE) MCG/ACT IN AERS
1.0000 | INHALATION_SPRAY | RESPIRATORY_TRACT | Status: DC | PRN
  Administered 2024-09-21 – 2024-09-24 (×7): 1 via RESPIRATORY_TRACT
  Filled 2024-09-21: qty 6.7

## 2024-09-21 NOTE — Group Note (Signed)
 Date:  09/21/2024 Time:  6:53 PM  Group Topic/Focus:  Relapse Prevention Planning:   The focus of this group is to define relapse and discuss the need for planning to combat relapse. Natural ways to get dopamine hits as opposed to illicit drugs which cause psychosis.      Participation Level:  Did Not Attend       Juliene CHRISTELLA Huddle 09/21/2024, 6:53 PM

## 2024-09-21 NOTE — Plan of Care (Signed)
   Problem: Education: Goal: Knowledge of [facility] Education information/materials will improve Outcome: Progressing Goal: Emotional status will improve Outcome: Progressing Goal: Mental status will improve Outcome: Progressing Goal: Verbalization of understanding the information provided will improve Outcome: Progressing   Problem: Activity: Goal: Interest or engagement in activities will improve Outcome: Progressing

## 2024-09-21 NOTE — Progress Notes (Signed)
(  Sleep Hours) -5 (Any PRNs that were needed, meds refused, or side effects to meds)- vistaril , trazadone (Any disturbances and when (visitation, over night)-not happy with his brother for being here (Concerns raised by the patient)- none (SI/HI/AVH)-denies all
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
## 2024-09-21 NOTE — Progress Notes (Signed)
" °   09/21/24 1600  Psych Admission Type (Psych Patients Only)  Admission Status Involuntary  Psychosocial Assessment  Patient Complaints Anxiety  Eye Contact Fair  Facial Expression Anxious  Affect Anxious  Speech Logical/coherent  Interaction Assertive  Motor Activity Other (Comment) (WDL)  Appearance/Hygiene In scrubs  Behavior Characteristics Cooperative;Guarded  Mood Anxious;Pleasant  Thought Process  Coherency Circumstantial  Content Preoccupation  Delusions None reported or observed  Perception WDL  Hallucination None reported or observed  Judgment Poor  Confusion None  Danger to Self  Current suicidal ideation? Denies  Danger to Others  Danger to Others None reported or observed    "

## 2024-09-21 NOTE — Progress Notes (Signed)
 Patient reports that he has a job on Tuesday and is hopeful to be discharged.  09/21/24 2211  Psych Admission Type (Psych Patients Only)  Admission Status Involuntary  Psychosocial Assessment  Eye Contact Brief  Facial Expression Anxious;Animated  Affect Anxious  Speech Logical/coherent  Interaction Assertive  Motor Activity Other (Comment) (wnl)  Appearance/Hygiene In scrubs  Behavior Characteristics Cooperative;Anxious  Mood Anxious;Pleasant  Thought Process  Coherency Circumstantial  Content Blaming others;Preoccupation  Delusions None reported or observed  Perception WDL  Hallucination None reported or observed  Judgment Poor  Confusion None  Danger to Self  Current suicidal ideation? Denies  Danger to Others  Danger to Others None reported or observed

## 2024-09-21 NOTE — Group Note (Signed)
 Date:  09/21/2024 Time:  4:09 PM  Group Topic/Focus:  Wellness Toolbox:   The focus of this group is to discuss various aspects of wellness, balancing those aspects and exploring ways to increase the ability to experience wellness.  Patients will create a wellness toolbox for use upon discharge.    Participation Level:  Did Not Attend   Vincent Wallace 09/21/2024, 4:09 PM

## 2024-09-21 NOTE — Group Note (Signed)
 Date:  09/21/2024 Time:  9:07 PM  Group Topic/Focus:  Wrap-Up Group:   The focus of this group is to help patients review their daily goal of treatment and discuss progress on daily workbooks.    Participation Level:  Active  Participation Quality:  Appropriate and Sharing  Affect:  Appropriate  Cognitive:  Appropriate  Insight: Appropriate  Engagement in Group:  Engaged  Modes of Intervention:  Discussion  Additional Comments:   Today is pts first day on the unit. Pt states that he rested most of the day due to the stressful nature of the day prior. Pt spoke with care providers about d/c planning. Pt endorsed anxiety at a 7 and pain in his back.   Braniyah Besse A Zyrell Carmean 09/21/2024, 9:07 PM

## 2024-09-21 NOTE — Group Note (Signed)
 Date:  09/21/2024 Time:  9:42 AM  Group Topic/Focus:  Goals Group:   The focus of this group is to help patients establish daily goals to achieve during treatment and discuss how the patient can incorporate goal setting into their daily lives to aide in recovery.    Participation Level:  Did Not Attend  Teegan Guinther A Trula Frede 09/21/2024, 9:42 AM

## 2024-09-21 NOTE — Plan of Care (Signed)
   Problem: Safety: Goal: Periods of time without injury will increase Outcome: Progressing
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
## 2024-09-21 NOTE — Group Note (Signed)
 Date:  09/21/2024 Time:  10:55 AM  Group Topic/Focus:  Dimensions of Wellness:   The focus of this group is to introduce the topic of wellness and discuss the role each dimension of wellness plays in total health. Emotional Education:   The focus of this group is to discuss what feelings/emotions are, and how they are experienced.    Participation Level:  Did Not Attend   Vincent Wallace 09/21/2024, 10:55 AM
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
## 2024-09-22 ENCOUNTER — Encounter (HOSPITAL_COMMUNITY): Payer: Self-pay

## 2024-09-22 DIAGNOSIS — F1020 Alcohol dependence, uncomplicated: Secondary | ICD-10-CM | POA: Diagnosis present

## 2024-09-22 DIAGNOSIS — F419 Anxiety disorder, unspecified: Secondary | ICD-10-CM | POA: Diagnosis present

## 2024-09-22 MED ORDER — MAGNESIUM CITRATE PO SOLN
1.0000 | Freq: Once | ORAL | Status: AC
  Administered 2024-09-22: 1 via ORAL
  Filled 2024-09-22: qty 296

## 2024-09-22 MED ORDER — MELATONIN 5 MG PO TABS
10.0000 mg | ORAL_TABLET | Freq: Once | ORAL | Status: AC
  Administered 2024-09-22: 10 mg via ORAL
  Filled 2024-09-22: qty 2

## 2024-09-22 MED ORDER — METHOCARBAMOL 500 MG PO TABS
500.0000 mg | ORAL_TABLET | Freq: Three times a day (TID) | ORAL | Status: DC | PRN
  Administered 2024-09-22 – 2024-09-23 (×2): 500 mg via ORAL
  Filled 2024-09-22 (×2): qty 1

## 2024-09-22 MED ORDER — IBUPROFEN 600 MG PO TABS
600.0000 mg | ORAL_TABLET | Freq: Four times a day (QID) | ORAL | Status: DC | PRN
  Administered 2024-09-23 – 2024-09-24 (×3): 600 mg via ORAL
  Filled 2024-09-22 (×3): qty 1

## 2024-09-22 MED ORDER — ADULT MULTIVITAMIN W/MINERALS CH
1.0000 | ORAL_TABLET | Freq: Every day | ORAL | Status: DC
  Administered 2024-09-23 – 2024-09-24 (×2): 1 via ORAL
  Filled 2024-09-22 (×2): qty 1

## 2024-09-22 MED ORDER — GABAPENTIN 100 MG PO CAPS
100.0000 mg | ORAL_CAPSULE | Freq: Three times a day (TID) | ORAL | Status: DC
  Administered 2024-09-22 – 2024-09-23 (×3): 100 mg via ORAL
  Filled 2024-09-22 (×3): qty 1

## 2024-09-22 MED ORDER — TRAZODONE HCL 100 MG PO TABS
100.0000 mg | ORAL_TABLET | Freq: Every evening | ORAL | Status: DC | PRN
  Administered 2024-09-22 – 2024-09-23 (×2): 100 mg via ORAL
  Filled 2024-09-22 (×2): qty 1
  Filled 2024-09-22: qty 7

## 2024-09-22 NOTE — Progress Notes (Signed)
 Pt attended grief and loss session

## 2024-09-22 NOTE — Group Note (Signed)
 Date:  09/22/2024 Time:  10:05 AM  Group Topic/Focus:  Goals Group:   The focus of this group is to help patients establish daily goals to achieve during treatment and discuss how the patient can incorporate goal setting into their daily lives to aide in recovery.    Participation Level:  Active  Participation Quality:  Appropriate  Affect:  Appropriate  Cognitive:  Appropriate  Insight: Appropriate  Engagement in Group:  Engaged  Modes of Intervention:  Discussion  Additional Comments:  attend groups, talk the providers about discharge. Pt stated he has post care setup for Wednesday  Nat Rummer 09/22/2024, 10:05 AM

## 2024-09-22 NOTE — Progress Notes (Signed)
 Patient denies SI, HI, AVH. Patient stated they slept Fair last night. Scored 5/10 on anxiety, zero on feeling of hopelessness, and zero on depression. Patient goal is to Group and states will Attend to help reach that goal. Patient has been cooperative and med compliant.     09/22/24 1000  Psych Admission Type (Psych Patients Only)  Admission Status Involuntary  Psychosocial Assessment  Patient Complaints Anxiety  Eye Contact Brief  Facial Expression Animated  Affect Anxious  Speech Logical/coherent  Interaction Assertive  Motor Activity Other (Comment) (WDL)  Appearance/Hygiene In scrubs  Behavior Characteristics Cooperative;Appropriate to situation  Mood Anxious;Pleasant  Thought Process  Coherency Circumstantial  Content Preoccupation  Delusions None reported or observed  Perception WDL  Hallucination None reported or observed  Judgment Poor  Confusion None  Danger to Self  Current suicidal ideation? Denies  Danger to Others  Danger to Others None reported or observed

## 2024-09-22 NOTE — Group Note (Signed)
 Recreation Therapy Group Note   Group Topic:Communication  Group Date: 09/22/2024 Start Time: 0944 End Time: 1009 Facilitators: Lesley Atkin-McCall, LRT,CTRS Location: [address] Dayroom   Group Topic: Communication, Problem Solving   Goal Area(s) Addresses:  Patient will effectively listen to complete activity.  Patient will identify communication skills used to make activity successful.  Patient will identify how skills used during activity can be used to reach post d/c goals.    Behavioral Response:    Intervention: Building Surveyor Activity - Geometric pattern cards, pencils, blank paper    Activity: Geometric Drawings.  Three volunteers from the peer group will be shown an abstract picture with a particular arrangement of geometrical shapes.  Each round, one 'speaker' will describe the pattern, as accurately as possible without revealing the image to the group.  The remaining group members will listen and draw the picture to reflect how it is described to them. Patients with the role of 'listener' cannot ask clarifying questions but, may request that the speaker repeat a direction. Once the drawings are complete, the presenter will show the rest of the group the picture and compare how close each person came to drawing the picture. LRT will facilitate a post-activity discussion regarding effective communication and the importance of planning, listening, and asking for clarification in daily interactions with others.  Education: Environmental consultant, Active listening, Support systems, Discharge planning  Education Outcome: Acknowledges understanding/In group clarification offered/Needs additional education.    Affect/Mood: N/A   Participation Level: Did not attend    Clinical Observations/Individualized Feedback:      Plan: Continue to engage patient in RT group sessions 2-3x/week.   Juliana Boling-McCall, LRT,CTRS 09/22/2024 1:05 PM

## 2024-09-22 NOTE — Progress Notes (Signed)
(  Sleep Hours) -7.5 (Any PRNs that were needed, meds refused, or side effects to meds)- vistaril ,trazodone  (Any disturbances and when (visitation, over night)-none (Concerns raised by the patient)- discharge by Tuesday (SI/HI/AVH)- denies all

## 2024-09-22 NOTE — Plan of Care (Signed)
   Problem: Activity: Goal: Interest or engagement in activities will improve Outcome: Progressing   Problem: Coping: Goal: Ability to demonstrate self-control will improve Outcome: Progressing

## 2024-09-22 NOTE — BH IP Treatment Plan (Signed)
 Interdisciplinary Treatment and Diagnostic Plan Update  09/22/2024 Time of Session: 10:35 am Vincent Wallace MRN: [id number]  Principal Diagnosis: Substance induced mood disorder (HCC)  Secondary Diagnoses: Principal Problem:   Substance induced mood disorder (HCC)   Current Medications:  Current Facility-Administered Medications  Medication Dose Route Frequency Provider Last Rate Last Admin   acetaminophen  (TYLENOL ) tablet 650 mg  650 mg Oral Q6H PRN Coleman, Carolyn H, NP   650 mg at 09/21/24 1937   albuterol  (VENTOLIN  HFA) 108 (90 Base) MCG/ACT inhaler 1 puff  1 puff Inhalation Q4H PRN Chandra Charleston Sherlean Ruth, DO   1 puff at 09/22/24 1152   alum & mag hydroxide-simeth (MAALOX/MYLANTA) 200-200-20 MG/5ML suspension 30 mL  30 mL Oral Q4H PRN Coleman, Carolyn H, NP       calcium  carbonate (TUMS - dosed in mg elemental calcium ) chewable tablet 200 mg of elemental calcium   1 tablet Oral TID WC Chandra Charleston Sherlean Ruth, DO   200 mg of elemental calcium  at 09/22/24 1151   chlordiazePOXIDE  (LIBRIUM ) capsule 25 mg  25 mg Oral Q6H PRN Mardy Elveria DEL, NP       chlordiazePOXIDE  (LIBRIUM ) capsule 25 mg  25 mg Oral TID Mannie Ashley SAILOR, MD   25 mg at 09/22/24 1151   Followed by   NOREEN ON 09/23/2024] chlordiazePOXIDE  (LIBRIUM ) capsule 25 mg  25 mg Oral Letha Mannie Ashley SAILOR, MD       Followed by   NOREEN ON 09/24/2024] chlordiazePOXIDE  (LIBRIUM ) capsule 25 mg  25 mg Oral Daily Mannie Ashley SAILOR, MD       haloperidol  (HALDOL ) tablet 5 mg  5 mg Oral TID PRN Mardy Elveria DEL, NP       And   diphenhydrAMINE  (BENADRYL ) capsule 50 mg  50 mg Oral TID PRN Mardy Elveria DEL, NP       haloperidol  lactate (HALDOL ) injection 5 mg  5 mg Intramuscular TID PRN Mardy Elveria DEL, NP       And   diphenhydrAMINE  (BENADRYL ) injection 50 mg  50 mg Intramuscular TID PRN Mardy Elveria DEL, NP       And   LORazepam  (ATIVAN ) injection 2 mg  2 mg Intramuscular TID PRN Coleman, Carolyn H, NP        haloperidol  lactate (HALDOL ) injection 10 mg  10 mg Intramuscular TID PRN Mardy Elveria DEL, NP       And   diphenhydrAMINE  (BENADRYL ) injection 50 mg  50 mg Intramuscular TID PRN Mardy Elveria DEL, NP       And   LORazepam  (ATIVAN ) injection 2 mg  2 mg Intramuscular TID PRN Coleman, Carolyn H, NP       hydrOXYzine  (ATARAX ) tablet 25 mg  25 mg Oral Q6H PRN Coleman, Carolyn H, NP   25 mg at 09/21/24 1937   magnesium  hydroxide (MILK OF MAGNESIA) suspension 30 mL  30 mL Oral Daily PRN Coleman, Carolyn H, NP       multivitamin with minerals tablet 1 tablet  1 tablet Oral Daily Mardy Elveria DEL, NP   1 tablet at 09/22/24 9177   nicotine  (NICODERM CQ  - dosed in mg/24 hours) patch 21 mg  21 mg Transdermal Daily Bobbitt, Shalon E, NP   21 mg at 09/22/24 9175   ondansetron  (ZOFRAN -ODT) disintegrating tablet 4 mg  4 mg Oral Q6H PRN Coleman, Carolyn H, NP       sertraline  (ZOLOFT ) tablet 50 mg  50 mg Oral Daily Mardy Elveria DEL, NP  50 mg at 09/22/24 9178   thiamine  (Vitamin B-1) tablet 100 mg  100 mg Oral Daily Coleman, Carolyn H, NP   100 mg at 09/22/24 9177   traZODone  (DESYREL ) tablet 100 mg  100 mg Oral QHS PRN Prentis Oliva LABOR, DO       PTA Medications: Medications Prior to Admission  Medication Sig Dispense Refill Last Dose/Taking   albuterol  (VENTOLIN  HFA) 108 (90 Base) MCG/ACT inhaler SMARTSIG:1-2 Puff(s) Via Inhaler PRN   Past Week   baclofen  (LIORESAL ) 10 MG tablet Take 1 tablet (10 mg total) by mouth 3 (three) times daily. 30 each 0 Past Week   calcium  carbonate (TUMS EX) 750 MG chewable tablet Chew 1 tablet by mouth 3 (three) times daily as needed for heartburn.   Past Month   HYDROcodone -acetaminophen  (NORCO/VICODIN) 5-325 MG per tablet Take 1-2 tablets by mouth every 4 (four) hours as needed. 16 tablet 0 Past Month   hydrOXYzine  (VISTARIL ) 25 MG capsule Take 25 mg by mouth 3 (three) times daily as needed for anxiety.   Past Week   ibuprofen  (ADVIL ,MOTRIN ) 200 MG tablet Take 400 mg by  mouth every 6 (six) hours as needed for mild pain or moderate pain.   Past Month   Multiple Vitamins-Minerals (CENTRUM ADULT PO) Take 1 tablet by mouth daily.   Past Week   oxyCODONE -acetaminophen  (PERCOCET/ROXICET) 5-325 MG per tablet Take 2 tablets by mouth every 4 (four) hours as needed for severe pain. 12 tablet 0 Past Month   zolpidem  (AMBIEN ) 10 MG tablet Take 10 mg by mouth at bedtime.   Past Week    Patient Stressors: Legal issue   Marital or family conflict   Substance abuse    Patient Strengths: Average or above average intelligence  Capable of independent living   Treatment Modalities: Medication Management, Group therapy, Case management,  1 to 1 session with clinician, Psychoeducation, Recreational therapy.   Physician Treatment Plan for Primary Diagnosis: Substance induced mood disorder (HCC) Long Term Goal(s):     Short Term Goals: Ability to disclose and discuss suicidal ideas Ability to identify and develop effective coping behaviors will improve Ability to identify triggers associated with substance abuse/mental health issues will improve  Medication Management: Evaluate patient's response, side effects, and tolerance of medication regimen.  Therapeutic Interventions: 1 to 1 sessions, Unit Group sessions and Medication administration.  Evaluation of Outcomes: Not Progressing  Physician Treatment Plan for Secondary Diagnosis: Principal Problem:   Substance induced mood disorder (HCC)  Long Term Goal(s):     Short Term Goals: Ability to disclose and discuss suicidal ideas Ability to identify and develop effective coping behaviors will improve Ability to identify triggers associated with substance abuse/mental health issues will improve     Medication Management: Evaluate patient's response, side effects, and tolerance of medication regimen.  Therapeutic Interventions: 1 to 1 sessions, Unit Group sessions and Medication administration.  Evaluation of  Outcomes: Not Progressing   RN Treatment Plan for Primary Diagnosis: Substance induced mood disorder (HCC) Long Term Goal(s): Knowledge of disease and therapeutic regimen to maintain health will improve  Short Term Goals: Ability to remain free from injury will improve, Ability to verbalize frustration and anger appropriately will improve, Ability to demonstrate self-control, Ability to participate in decision making will improve, Ability to verbalize feelings will improve, Ability to disclose and discuss suicidal ideas, Ability to identify and develop effective coping behaviors will improve, and Compliance with prescribed medications will improve  Medication Management: RN will administer medications as  ordered by provider, will assess and evaluate patient's response and provide education to patient for prescribed medication. RN will report any adverse and/or side effects to prescribing provider.  Therapeutic Interventions: 1 on 1 counseling sessions, Psychoeducation, Medication administration, Evaluate responses to treatment, Monitor vital signs and CBGs as ordered, Perform/monitor CIWA, COWS, AIMS and Fall Risk screenings as ordered, Perform wound care treatments as ordered.  Evaluation of Outcomes: Not Progressing   LCSW Treatment Plan for Primary Diagnosis: Substance induced mood disorder (HCC) Long Term Goal(s): Safe transition to appropriate next level of care at discharge, Engage patient in therapeutic group addressing interpersonal concerns.  Short Term Goals: Engage patient in aftercare planning with referrals and resources, Increase social support, Increase ability to appropriately verbalize feelings, Increase emotional regulation, Facilitate acceptance of mental health diagnosis and concerns, Facilitate patient progression through stages of change regarding substance use diagnoses and concerns, Identify triggers associated with mental health/substance abuse issues, and Increase skills  for wellness and recovery  Therapeutic Interventions: Assess for all discharge needs, 1 to 1 time with Social worker, Explore available resources and support systems, Assess for adequacy in community support network, Educate family and significant other(s) on suicide prevention, Complete Psychosocial Assessment, Interpersonal group therapy.  Evaluation of Outcomes: Not Progressing   Progress in Treatment: Attending groups: Patient has attended some groups.  Participating in groups: Yes. Taking medication as prescribed: Yes. Toleration medication: Yes. Family/Significant other contact made: No, will contact:  Patient declined consents. Patient understands diagnosis: Yes. Discussing patient identified problems/goals with staff: Yes. Medical problems stabilized or resolved: Yes. Denies suicidal/homicidal ideation: Yes. Issues/concerns per patient self-inventory: No. None reported.  New problem(s) identified: No, Describe:  None identified.  New Short Term/Long Term Goal(s): detox, medication management for mood stabilization; elimination of SI thoughts; development of comprehensive mental wellness/sobriety plan    Patient Goals: Just go to group and absorb information. Learn from groups and tell my story too.  Discharge Plan or Barriers: Patient recently admitted. CSW will continue to follow and assess for appropriate referrals and possible discharge planning.    Reason for Continuation of Hospitalization: Medication stabilization Withdrawal symptoms  Estimated Length of Stay: 3-5 days.  Last 3 [place] Suicide Severity Risk Score: Flowsheet Row Ad[facility] (Current) [facility] from [facility]15/2024 in [facility] RISK CATEGORY No Risk Error: Q6 is Yes, you must answer 7    Last PHQ 2/9 Scores:     No data to display          Scribe for Treatment Team: Hermione Havlicek  Nunez-Uva, LCSWA 09/22/2024 1:55  PM

## 2024-09-22 NOTE — Group Note (Signed)
 Date:  09/22/2024 Time:  9:31 PM  Group Topic/Focus:  Wrap-Up Group:   The focus of this group is to help patients review their daily goal of treatment and discuss progress on daily workbooks.    Participation Level:  Active  Participation Quality:  Appropriate  Affect:  Appropriate  Cognitive:  Appropriate  Insight: Appropriate  Engagement in Group:  Engaged  Modes of Intervention:  Education  Additional Comments:  Patient attended and participated in group tonight.  Gwenn Chillington Dacosta 09/22/2024, 9:31 PM

## 2024-09-22 NOTE — Progress Notes (Addendum)
 [facility] Inpatient Psychiatry Progress Note  Date: 09/22/24 Patient: Vincent Wallace MRN: [id number]  Assessment and Plan: Vincent Wallace is a 44 y.o. male with a history of AUD and one remote instance of inpatient SUD treatment who was admitted involuntarily due to recent safety concerns and risk for suicide.   1/19 - Since admission patient has been denying suicidal ideation since admission and continues to insist that his brother's statements on the IVC paperwork are well-meaning but misunderstood. I reviewed the IVC paperwork and the petitioner cites multiple incidents over the past few months that are concerning. Still, there does not appear to be any clear direct indicators of imminent danger to self, such as clear communication of a threat, evidence of means seeking, or preparatory behavior. Still, reports of multiple episodes of suicidal threats, his prior suicide attempt, and ongoing drug use are concerning. On the other hand, he has been consistently reporting that he is hopeful and optimistic at the prospect of employment and shares a specific plan. If this plan can be verified this would be evidence of strong future orientation.   # Substance induced mood disorder (HCC) # Alcohol use disorder, severe # Stimulant use disorder, unspecified - Librium  taper: 75 mg total today, 50 mg total tomorrow, 25 mg on 1/21, then stop. - CIWA monitoring - GGT - Discussed outpatient SUD treatment with patient   # Unspecified Anxiety Disorder - Sertraline  50 mg daily  # Back Pain / Sciatica - Methocarbamol  500 mg q8h prn - Gabapentin  100 mg TID   Risk Assessment - Unable to accurately assess due to apparent guarding by patient  Discharge Planning Barriers to discharge: Further risk assessment and mitigation Estimated length of stay: 2-3 days Predicted Discharge location: [facility]     Interval History and update: No significant events overnight. Patient has  been compliant with prescribed medication. Today he has been visible and active in the milieu, socializing with patients. He has endorsed back and sciatic pain to staff and is requesting some medications for this. We had a lengthy discussion of the events that led to this hospitalization. He continues to state that his brother is verbally abusive to him only when no one else is around, and that this is a major contributor to his depression. He admitted to long-term problematic alcohol use culminating with the DUI on December 3rd, but denied steady use prior to admission. He also denied any suicidal ideation or intent prior to admission. He stated that the life insurance policy was a practical decision due to workplace safety hazards and that he felt his brother would be a logical beneficiary. He also stated that prior suicidal statements and actions had been calls for help, and when ask specifically affirmed that this was the case with the attempt via hanging years ago. He stated that he has been active in AA and plans on continuing to be active in this. 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 We discussed this at length and he reported that his friend will be purchasing a plane ticket for him and that the job pays $30/hour and he has been planning on using these funds to help get into an [address] or sober living facility. He reported that his  phone is at home and he does not know any ways to contact Vincent Wallace at this time.   He otherwise states that he is hopeful for change and further improvement and denies suicidal ideation and plans to remain sober for the indefinite future.       Physical Exam MSK/Neuro - Normal gait and station  Mental Status Exam Appearance - Casually  dressed, appropriate hygiene and grooming  Attitude - Calm, polite, not guarded, friendly Speech - normal volume, prosody, inflection, occasional stutter or word-finding difficulty Mood - hopeful Affect - Full to slightly odd Thought Process - LLGD Thought Content - No delusional TC expressed SI/HI - Denies  Perceptions - Denies AVH; not RIS Judgement/Insight - appears fair Fund of knowledge - WNL Language - No impairments      Lab Results:  No visits with results within 1 Day(s) from this visit.  Latest known visit with results is:  No results found for any previous visit.     Vitals: Blood pressure (!) 117/95, pulse 66, temperature 97.8 F (36.6 C), temperature source Oral, resp. rate 17, height 6' (1.829 m), weight 71.7 kg, SpO2 100%.    Oliva DELENA Salmon, DO

## 2024-09-22 NOTE — Progress Notes (Signed)
 Pt did attend rec therapy

## 2024-09-22 NOTE — Progress Notes (Signed)
 Pt did not attend OT therapy

## 2024-09-22 NOTE — Plan of Care (Signed)
   Problem: Education: Goal: Knowledge of [facility] Education information/materials will improve Outcome: Progressing Goal: Emotional status will improve Outcome: Progressing Goal: Mental status will improve Outcome: Progressing Goal: Verbalization of understanding the information provided will improve Outcome: Progressing   Problem: Activity: Goal: Interest or engagement in activities will improve Outcome: Progressing

## 2024-09-23 DIAGNOSIS — F1994 Other psychoactive substance use, unspecified with psychoactive substance-induced mood disorder: Principal | ICD-10-CM

## 2024-09-23 DIAGNOSIS — F1590 Other stimulant use, unspecified, uncomplicated: Secondary | ICD-10-CM

## 2024-09-23 DIAGNOSIS — F1020 Alcohol dependence, uncomplicated: Secondary | ICD-10-CM

## 2024-09-23 DIAGNOSIS — F419 Anxiety disorder, unspecified: Secondary | ICD-10-CM

## 2024-09-23 LAB — GAMMA GT: GGT: 48 U/L (ref 7–50)

## 2024-09-23 MED ORDER — ZOLPIDEM TARTRATE 5 MG PO TABS: 10.0000 mg | ORAL_TABLET | Freq: Every evening | ORAL | Status: DC | PRN

## 2024-09-23 MED ORDER — NALTREXONE HCL 50 MG PO TABS
25.0000 mg | ORAL_TABLET | Freq: Every day | ORAL | Status: DC
  Administered 2024-09-23: 25 mg via ORAL
  Filled 2024-09-23: qty 4
  Filled 2024-09-23: qty 1

## 2024-09-23 MED ORDER — OXYCODONE HCL 5 MG PO TABS
5.0000 mg | ORAL_TABLET | Freq: Once | ORAL | Status: AC
  Administered 2024-09-23: 5 mg via ORAL
  Filled 2024-09-23: qty 1

## 2024-09-23 MED ORDER — METHOCARBAMOL 750 MG PO TABS
750.0000 mg | ORAL_TABLET | Freq: Three times a day (TID) | ORAL | Status: DC | PRN
  Administered 2024-09-23 – 2024-09-24 (×2): 750 mg via ORAL
  Filled 2024-09-23 (×2): qty 1

## 2024-09-23 NOTE — Group Note (Signed)
 Date:  09/23/2024 Time:  3:55 PM  Group Topic/Focus: Write a note to your past or present self  Emotional Education:   The focus of this group is to discuss what feelings/emotions are, and how they are experienced.    Participation Level:  Active  Participation Quality:  Appropriate  Affect:  Appropriate  Cognitive:  Appropriate  Insight: Appropriate  Engagement in Group:  Engaged  Modes of Intervention:  Discussion  Additional Comments:  Pt engaged appropriately during group.  Eusebia Grulke D Jaiyden Laur 09/23/2024, 3:55 PM

## 2024-09-23 NOTE — Progress Notes (Signed)
(  Sleep Hours) - 6.25 (Any PRNs that were needed, meds refused, or side effects to meds)- PRN Albuterol , PRN Hydroxyzine , PRN Trazodone , PRN Robaxin , one time dose of Melatonin, and one time dose of Magnesium  Citrate  (Any disturbances and when (visitation, over night)- None (Concerns raised by the patient)- Pt reported that he would prefer for his Trazodone  dose to be increased. Encouraged pt to speak with MD in the morning about that request. (SI/HI/AVH)- Denies. Contracts for safety.

## 2024-09-23 NOTE — Progress Notes (Signed)
 [facility] Inpatient Psychiatry Progress Note  Date: 09/23/24 Patient: Vincent Wallace MRN: [id number]  Assessment and Plan: SELWYN REASON is a 44 y.o. male with a history of AUD and one remote instance of inpatient SUD treatment who was admitted involuntarily due to recent safety concerns and risk for suicide.   1/20 - Patient again demonstrates a reasonably full affect without any overt indicators of depression and continues to deny suicidal ideation. He has consistently presented as fixated on employment and was actively engaged in discussing his discharge plan and his medications. Since admission there has been no clear evidence that he has been harboring suicidal ideation or intent. His history is consistent with substance abuse, and at this time it appears likely that during periods of intoxication he engages in impulsive suicidal behavior. Per his own report, he has also engaged in intentionally manipulative behaviors surrounding suicidal behavior in the past. In my clinical opinion there is insufficient evidence to warrant continuing involuntary hospitalization, and he is planned for discharge tomorrow morning.   # Substance induced mood disorder (HCC) # Alcohol use disorder, severe # Stimulant use disorder, unspecified - Librium  taper: 75 mg total today, 50 mg total tomorrow, 25 mg on 1/21, then stop. - CIWA monitoring - GGT not elevated - Discussed outpatient SUD treatment with patient - he declines an inpatient program but expresses intent to continue with AA - Naltrexone  25 mg tonight, 50 mg daily tomorrow   # Unspecified Anxiety Disorder - Sertraline  50 mg daily  # Back Pain / Sciatica - Methocarbamol  750 mg q8h prn - DC gabapentin    Risk Assessment - Low: patient does not present with any direct indicators of imminent danger to self, and does not have a recent known attempt (Clear communication of suicidal intent, evidence of preparation,  seeking access to means; DOD/VA suicide risk assessment guidelines, April 2024)   Discharge Planning Barriers to discharge: Further risk assessment and mitigation Estimated length of stay: DC tomorrow Predicted Discharge location: Home     Interval History and update: No significant events overnight. Patient has been reporting ongoing back pain and stated that methocarbamol  and gabapentin  were not helpful. He stated that he did not sleep very well last night without zolpidem , which he has been chronically taking. However, he stated that today his mood is significantly improved. He reported mild anxiety and again denied suicidal ideation.  He continues to be focused on employment and the job he reports he should be starting soon in [place] . Today he wanted to go over his medications one at a time. We discussed sertraline , its role in his treatment and the rationale for taking this. He expressed ambivalence over taking this after he is discharged from the hospital. He did express intent to continue with counseling and AA and wanted to discuss starting disulfuram. We discussed naltrexone  instead and he was amenable to a trial of this. 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 He stated that he would very much like to continue hydroxyzine  HCL after discharge as he believes this has been very helpful for his anxiety. He has not been exhibiting nor reporting any signs or symptoms of alcohol withdrawal.       Physical Exam MSK/Neuro - Normal gait and station  Mental Status Exam Appearance - Casually dressed, appropriate hygiene and grooming  Attitude - Calm, polite, not guarded, friendly Speech - normal volume,  prosody, inflection, occasional stutter or word-finding difficulty Mood - okay Affect - Full to slightly odd Thought Process - LLGD Thought Content - No delusional TC expressed SI/HI - Denies   Perceptions - Denies AVH; not RIS Judgement/Insight - appears fair Fund of knowledge - WNL Language - No impairments      Lab Results:  Admission on 09/20/2024  Component Date Value Ref Range Status   GGT 09/22/2024 48  7 - 50 U/L Final     Vitals: Blood pressure 101/80, pulse 69, temperature 97.8 F (36.6 C), temperature source Oral, resp. rate 17, height 6' (1.829 m), weight 71.7 kg, SpO2 97%.    Oliva DELENA Salmon, DO

## 2024-09-23 NOTE — Group Note (Signed)
 Date:  09/23/2024 Time:  1:06 PM  Group Topic/Focus: Social work/ self work and confidence Self Care:   The focus of this group is to help patients understand the importance of self-care in order to improve or restore emotional, physical, spiritual, interpersonal, and financial health.    Participation Level:  Active   Vincent Wallace 09/23/2024, 1:06 PM

## 2024-09-23 NOTE — [facility] Group Notes (Signed)
 Spirituality Group   Group Goal: Support / Education around grief and loss   Group Description: Following introductions and group rules, group members engaged in facilitated group dialog and support around topic of loss, with particular support around experiences of loss in their lives. Group members identified types of loss (relationships / self / things) as well as patterns, circumstances, and changes that precipitate loss. Reflection invited on thoughts / feelings around loss, normalized grief responses, and recognized variety in grief experience. Group noted Worden's four tasks of grief in discussion. Group drew on Adlerian / Rogerian, narrative, MI, with [place] group therapy as a primary framework.   Observations: Vincent Wallace was present and was passively engaged in the group discussion.  [name] L. [name] HERO.Div

## 2024-09-23 NOTE — Group Note (Signed)
 LCSW Group Therapy Note   Group Date: 09/23/2024 Start Time: 1100 End Time: 1200   Participation:  patient was present and actively participated in the discussion  Type of Therapy:  Group Therapy  Topic:  Shining from Within:  Confidence and Self-Love Journey  Objective:  Promote Self-Awareness and Realistic Self-Talk: Help participants recognize their strengths and replace negative thoughts with truthful, realistic statements to build confidence.  Goals: Increase Confidence: Help participants develop a positive self-image by focusing on their strengths and personal progress. Set Achievable Goals: Guide participants in creating small, realistic goals that foster a sense of accomplishment and build momentum. Enhance Self-Care Practices: Encourage participants to incorporate self-care activities into their routine to support emotional well-being and reinforce confidence.  Summary:  The Shining from Within: Confidence and Self-Love Journey group helped individuals build stronger self-confidence through truthful, realistic self-talk, goal-setting, and self-care practices. Participants recognized their unique strengths, set achievable goals, and nurtured a supportive environment for mutual growth. The group provided practical tools to foster lasting confidence and self-belief, empowering each member to shine from within and embrace their personal power with greater self-assurance.  Therapeutic Modalities:   Cognitive Behavioral Therapy (CBT): Identify and reframe negative self-talk into realistic, positive thoughts Strengths-Based Therapy: Focus on personal strengths, successes, and abilities Psychoeducation: Teach concepts of self-esteem, confidence, and healthy self-talk   Catherene MALVA Dynes, LCSW 09/23/2024  12:48 PM

## 2024-09-23 NOTE — Group Note (Signed)
 Recreation Therapy Group Note   Group Topic:Animal Assisted Therapy   Group Date: 09/23/2024 Start Time: 9049 End Time: 1031 Facilitators: Yeray Tomas-McCall, LRT,CTRS Location: [address] Dayroom   Animal-Assisted Activity (AAA) Program Checklist/Progress Notes Patient Eligibility Criteria Checklist & Daily Group note for Rec Tx Intervention  AAA/T Program Assumption of Risk Form signed by Patient/ or Parent Legal Guardian Yes  Patient is free of allergies or severe asthma Yes  Patient reports no fear of animals Yes  Patient reports no history of cruelty to animals Yes  Patient understands his/her participation is voluntary Yes  Patient washes hands before animal contact Yes  Patient washes hands after animal contact Yes  Behavioral Response: Engaged   Education: Charity Fundraiser, Appropriate Animal Interaction   Education Outcome: Acknowledges education.    Affect/Mood: Appropriate   Participation Level: Active   Participation Quality: Independent   Behavior: Appropriate   Speech/Thought Process: Focused   Insight: Good   Judgement: Good   Modes of Intervention: Teaching Laboratory Technician   Patient Response to Interventions:  Engaged   Education Outcome:  In group clarification offered    Clinical Observations/Individualized Feedback: Pt attentive and quiet. Pt was focused and had some interaction with the therapy dog team.      Plan: Continue to engage patient in RT group sessions 2-3x/week.   Vincent Wallace, LRT,CTRS 09/23/2024 12:53 PM

## 2024-09-23 NOTE — Group Note (Signed)
 Date:  09/23/2024 Time:  4:28 PM  Group Topic/Focus: [place] Goals Group:   The focus of this group is to help patients establish daily goals to achieve during treatment and discuss how the patient can incorporate goal setting into their daily lives to aide in recovery.    Participation Level:  Active   Dolores HERO Harim Bi 09/23/2024, 4:28 PM

## 2024-09-23 NOTE — Group Note (Signed)
 Date:  09/23/2024 Time:  10:55 AM  Group Topic/Focus: Pet therapy Overcoming Stress:   The focus of this group is to define stress and help patients assess their triggers.    Participation Level:  Active   Dolores HERO Dewaun Kinzler 09/23/2024, 10:55 AM

## 2024-09-23 NOTE — Plan of Care (Signed)
   Problem: Safety: Goal: Periods of time without injury will increase Outcome: Progressing

## 2024-09-23 NOTE — Progress Notes (Signed)
 Vincent Wallace   Type of Note: Check In  Spoke with patient regarding f/u appointments. Pt reports being established with [facility] for therapy and medication management. Sees both providers virtually. No needs from CSW at this time.   Signed:  Estreya Clay, LCSW-A 09/23/2024  2:12 PM

## 2024-09-23 NOTE — BHH Group Notes (Signed)
 Adult Psychoeducational Group Note  Date:  09/23/2024 Time:  8:40 PM  Group Topic/Focus:  Wrap-Up Group:   The focus of this group is to help patients review their daily goal of treatment and discuss progress on daily workbooks.  Participation Level:  Active  Participation Quality:  Attentive  Affect:  Appropriate  Cognitive:  Appropriate  Insight: Appropriate  Engagement in Group:  Engaged  Modes of Intervention:  Discussion  Additional Comments:  Patient attended and participated in the Wrap-up group.  Vincent Wallace 09/23/2024, 8:40 PM

## 2024-09-23 NOTE — Progress Notes (Signed)
(  Sleep Hours) - (Any PRNs that were needed, meds refused, or side effects to meds)- PRN Albuterol , PRN Hydroxyzine , PRN Ibuprofen , and PRN Trazodone  (Any disturbances and when (visitation, over night)- Denies (Concerns raised by the patient)- Denies (SI/HI/AVH)- Denies. Contracts for safety.

## 2024-09-23 NOTE — Group Note (Signed)
 Date:  09/23/2024 Time:  9:49 AM  Group Topic/Focus: goals and orientation Goals Group:   The focus of this group is to help patients establish daily goals to achieve during treatment and discuss how the patient can incorporate goal setting into their daily lives to aide in recovery. Orientation:   The focus of this group is to educate the patient on the purpose and policies of crisis stabilization and provide a format to answer questions about their admission.  The group details unit policies and expectations of patients while admitted.    Participation Level:  Active  Participation Quality:  Appropriate  Affect:  Appropriate  Cognitive:  Alert  Insight: Appropriate  Engagement in Group:  Engaged  Modes of Intervention:  Orientation  Additional Comments:    Vincent Wallace 09/23/2024, 9:49 AM

## 2024-09-23 NOTE — Progress Notes (Signed)
" °   09/23/24 0848  Psych Admission Type (Psych Patients Only)  Admission Status Involuntary  Psychosocial Assessment  Patient Complaints Anxiety  Eye Contact Fair  Facial Expression Anxious  Affect Anxious  Speech Logical/coherent  Interaction Assertive  Motor Activity Fidgety  Appearance/Hygiene Unremarkable  Behavior Characteristics Cooperative  Mood Pleasant  Thought Process  Coherency Circumstantial  Content Preoccupation  Delusions None reported or observed  Perception WDL  Hallucination None reported or observed  Judgment Poor  Confusion None  Danger to Self  Current suicidal ideation? Denies  Danger to Others  Danger to Others None reported or observed    "

## 2024-09-23 NOTE — Plan of Care (Signed)
  Problem: Safety: Goal: Periods of time without injury will increase Outcome: Progressing   Problem: Activity: Goal: Interest or engagement in activities will improve Outcome: Progressing

## 2024-09-23 NOTE — Plan of Care (Signed)
   Problem: Education: Goal: Knowledge of [facility] Education information/materials will improve Outcome: Progressing Goal: Emotional status will improve Outcome: Progressing Goal: Mental status will improve Outcome: Progressing Goal: Verbalization of understanding the information provided will improve Outcome: Progressing

## 2024-09-24 MED ORDER — OXYCODONE HCL 5 MG PO TABS
5.0000 mg | ORAL_TABLET | Freq: Once | ORAL | Status: AC
  Administered 2024-09-24: 5 mg via ORAL
  Filled 2024-09-24: qty 1

## 2024-09-24 MED ORDER — NALTREXONE HCL 50 MG PO TABS: 25.0000 mg | ORAL_TABLET | Freq: Every day | ORAL | 0 refills | Status: AC

## 2024-09-24 MED ORDER — SERTRALINE HCL 50 MG PO TABS: 50.0000 mg | ORAL_TABLET | Freq: Every day | ORAL | 0 refills | Status: AC

## 2024-09-24 MED ORDER — ZOLPIDEM TARTRATE 10 MG PO TABS: 10.0000 mg | ORAL_TABLET | Freq: Every evening | ORAL | 0 refills | Status: AC | PRN

## 2024-09-24 MED ORDER — HYDROXYZINE HCL 25 MG PO TABS: 25.0000 mg | ORAL_TABLET | Freq: Three times a day (TID) | ORAL | Status: DC | PRN

## 2024-09-24 MED ORDER — HYDROXYZINE HCL 25 MG PO TABS: 25.0000 mg | ORAL_TABLET | Freq: Three times a day (TID) | ORAL | 0 refills | Status: AC | PRN

## 2024-09-24 MED ORDER — NICOTINE 21 MG/24HR TD PT24: 21.0000 mg | MEDICATED_PATCH | Freq: Every day | TRANSDERMAL | 0 refills | Status: AC

## 2024-09-24 NOTE — Transportation (Signed)
 09/24/2024  Vincent Wallace DOB: 12-15-1980 MRN: [id number]   RIDER WAIVER AND RELEASE OF LIABILITY  For the purposes of helping with transportation needs, [facility] partners with outside transportation providers (taxi companies, [place], catering manager.) to give [place] patients or other approved people the choice of on-demand rides Public Librarian) to our buildings for non-emergency visits.  By using [employer], I, the person signing this document, on behalf of myself and/or any legal minors (in my care using the [employer]), agree:  Science Writer given to me are supplied by independent, outside transportation providers who do not work for, or have any affiliation with, [employer]. [facility] is not a transportation company. [facility] has no control over the quality or safety of the rides I get using [employer]. [facility] has no control over whether any outside ride will happen on time or not. [facility] gives no guarantee on the reliability, quality, safety, or availability on any rides, or that no mistakes will happen. I know and accept that traveling by vehicle (car, truck, SVU, fleeta, bus, taxi, etc.) has risks of serious injuries such as disability, being paralyzed, and death. I know and agree the risk of using [employer] is mine alone, and not [employer]. [employer] are provided as is and as are available. The transportation providers are in charge for all inspections and care of the vehicles used to provide these rides. I agree not to take legal action against [facility], its agents, employees, officers, directors, representatives, insurers, attorneys, assigns, successors, subsidiaries, and affiliates at any time for any reasons related directly or indirectly to using [employer]. I also agree not to take legal action against [facility] or its affiliates for any injury, death, or damage to property caused by or related to  using [employer]. I have read this Waiver and Release of Liability, and I understand the terms used in it and their legal meaning. This Waiver is freely and voluntarily given with the understanding that my right (or any legal minors) to legal action against [facility] relating to [employer] is knowingly given up to use these services.   I attest that I read the Ride Waiver and Release of Liability to Vincent Wallace, gave Vincent Wallace the opportunity to ask questions and answered the questions asked (if any). I affirm that Vincent Wallace then provided consent for assistance with transportation.

## 2024-09-24 NOTE — Group Note (Signed)
 Date:  09/24/2024 Time:  6:34 PM  Group Topic/Focus:  Goals Group:   The focus of this group is to help patients establish daily goals to achieve during treatment and discuss how the patient can incorporate goal setting into their daily lives to aide in recovery. Orientation:   The focus of this group is to educate the patient on the purpose and policies of crisis stabilization and provide a format to answer questions about their admission.  The group details unit policies and expectations of patients while admitted.    Participation Level:  Did Not Attend  Vincent Wallace 09/24/2024, 6:34 PM

## 2024-09-24 NOTE — Progress Notes (Signed)
Discharge Note:  Patient denies SI/HI/AVH at this time. Discharge instructions, AVS, prescriptions, and transition record gone over with patient. Patient agrees to adhere with medication management, follow-up visit, and outpatient therapy. Patient belongings returned to patient. Patient questions and concerns addressed and answered. Patient ambulatory off unit. Patient discharged to home via taxi.

## 2024-09-24 NOTE — BHH Suicide Risk Assessment (Signed)
 B[facility] INPATIENT:  Family/Significant Other Suicide Prevention Education  Suicide Prevention Education:   Suicide Prevention Education was reviewed thoroughly with patient, including risk factors, warning signs, and what to do. Mobile Crisis services were described and that telephone number pointed out, with encouragement to patient to put this number in personal cell phone. Brochure was provided to patient to share with natural supports. Patient acknowledged the ways in which they are at risk, and how working through each of their issues can gradually start to reduce their risk factors. Patient was encouraged to think of the information in the context of people in their own lives. Patient denied having access to firearms Patient verbalized understanding of information provided. Patient endorsed a desire to live.

## 2024-09-24 NOTE — BHH Suicide Risk Assessment (Signed)
 [facility] Discharge Suicide Risk Assessment   Principal Problem: Substance induced mood disorder (HCC) Discharge Diagnoses: Principal Problem:   Substance induced mood disorder (HCC) Active Problems:   Anxiety disorder   Alcohol use disorder, severe, dependence (HCC)   Demographic Factors:  Male, Low socioeconomic status, and Unemployed (pending employment)  Loss Factors: Financial problems/change in socioeconomic status  Historical Factors: Prior suicide attempts and Impulsivity  Risk Reduction Factors:   Employed  Continued Clinical Symptoms:  Substance-induced mood disorder; anxiety  Cognitive Features That Contribute To Risk:  None    Suicide Risk:  Mild:  Suicidal ideation of limited frequency, intensity, duration, and specificity.  There are no identifiable plans, no associated intent, mild dysphoria and related symptoms, good self-control (both objective and subjective assessment), few other risk factors, and identifiable protective factors, including available and accessible social support.   Follow-up Information     [facility] [place] Follow up on 10/06/2024.   Why: You have an appointment on 10/06/24 at 5:00 pm for therapy services, Virtual.  You also have an appointment on 10/07/24 at 10:00 am for medication management services, Virtual. Contact information: [address] [place], [place] [address] Phone: [phone]                Oliva DELENA Salmon, DO 09/24/2024, 8:39 AM

## 2024-09-24 NOTE — Progress Notes (Signed)
" °  [facility] Adult Case Management Discharge Plan :  Will you be returning to the same living situation after discharge:  Yes,  pt returning home at discharge At discharge, do you have transportation home?: Yes,  CSW arranged [employer] taxi for 10AM to home address on file  Do you have the ability to pay for your medications: No. Meds requested from pharm prior to discharge  Release of information consent forms completed and in the chart;  Patient's signature needed at discharge.  Patient to Follow up at:  Follow-up Information     [facility] [place] Follow up on 10/06/2024.   Why: You have an appointment on 10/06/24 at 5:00 pm for therapy services, Virtual.  You also have an appointment on 10/07/24 at 10:00 am for medication management services, Virtual. Contact information: [address], [place], [place] [address] Phone: [phone]                Next level of care provider has access to [facility] Link:no  Safety Planning and Suicide Prevention discussed: Yes,  completed with patient at discharge     Has patient been referred to the Quitline?: Patient refused referral for treatment I am going to vape instead and hopefully wean off that myself  Patient has been referred for addiction treatment: Patient refused referral for treatment.  Jenkins LULLA Primer, LCSWA 09/24/2024, 8:58 AM "

## 2024-09-26 NOTE — Discharge Summary (Signed)
 " Physician Discharge Summary Note  Patient:  Vincent Wallace is an 44 y.o., male MRN:  [id number] DOB:  [birth year] Patient phone:  [phone] (home)  Patient address:   [address] [place] [address],  Total Time spent with patient: 1 hour  Date of Admission:  09/20/2024 Date of Discharge: 09/15/2024  Reason for Admission:  The patient was admitted involuntarily due to reported suicidal ideation and concerning behaviors in the context of severe alcohol use disorder and suspected substance-induced mood symptoms. Collateral from family described suicidal threats, preparatory behaviors, and heavy substance use, raising concern for acute suicide risk.  Principal Problem: Substance induced mood disorder [facility]) Discharge Diagnoses: Principal Problem:   Substance induced mood disorder (HCC) Active Problems:   Anxiety disorder   Alcohol use disorder, severe, dependence [facility])     Hospital Course:  Vincent Wallace was admitted for safety, stabilization, and further evaluation following IVC for suicidal ideation and threats. On admission, he denied current suicidal or homicidal ideation and consistently minimized risk, though collateral information from his brother described repeated suicidal statements, preparatory behaviors, and a prior suicide attempt while intoxicated.  During hospitalization, the patient remained calm, cooperative, and engaged appropriately on the unit. He demonstrated a full affect, linear thought process, and consistently denied suicidal or homicidal ideation, intent, or plan. No psychotic symptoms were observed. He did not exhibit behavioral dysregulation and participated in discussions regarding treatment and discharge planning.  Given concerns for alcohol withdrawal and unreliable substance use history, he was managed on CIWA protocol with a scheduled Librium  taper, which he tolerated without complication. No significant withdrawal symptoms were observed. GGT was within  normal limits.  Sertraline  50 mg daily was continued for mood and anxiety symptoms. Naltrexone  was initiated for alcohol use disorder, which the patient accepted after discussion of risks and benefits. He declined inpatient substance use treatment but expressed intent to continue Alcoholics Anonymous and outpatient services.  Throughout admission, he demonstrated future orientation, particularly around employment plans, and remained focused on discharge. Over the course of hospitalization, there was no evidence of active suicidal ideation, intent, or imminent risk. Based on serial assessments, collateral review, and observed behavior, it was determined that criteria for continued involuntary hospitalization were no longer met.    Physical Exam Vitals and nursing note reviewed.  Constitutional:      Appearance: Normal appearance.  Pulmonary:     Effort: Pulmonary effort is normal.  Neurological:     General: No focal deficit present.     Mental Status: He is alert and oriented to person, place, and time.    Review of Systems  Constitutional: Negative.   Respiratory: Negative.    Cardiovascular: Negative.    Mental Status Exam: Appearance - Casually dressed, appropriate hygiene and grooming *** Eye-Contact - Normal Attitude - Calm, polite, not guarded Speech - normal volume, prosody, inflection Mood -  Affect -  Thought Process - LLGD Thought Content - No delusional TC expressed SI/HI - Denies *** Perceptions - Denies AVH; not RIS Judgement/Insight - Good *** Fund of knowledge - WNL Language - No impairments   Blood pressure 124/81, pulse 70, temperature (!) 97.4 F (36.3 C), temperature source Oral, resp. rate 17, height 6' (1.829 m), weight 71.7 kg, SpO2 99%. Body mass index is 21.43 kg/m.   Tobacco Use History[1] Tobacco Cessation:  A prescription for an FDA-approved tobacco cessation medication was offered at discharge and the patient refused   Blood Alcohol level:   No results found for:  ETH  Metabolic Disorder Labs:  No results found for: HGBA1C, MPG No results found for: PROLACTIN No results found for: CHOL, TRIG, HDL, CHOLHDL, VLDL, LDLCALC  See Psychiatric Specialty Exam and Suicide Risk Assessment completed by Attending Physician prior to discharge.  Discharge destination:  Home  Is patient on multiple antipsychotic therapies at discharge:  No   Allergies as of 09/24/2024   No Known Allergies      Medication List     STOP taking these medications    calcium  carbonate 750 MG chewable tablet Commonly known as: TUMS EX   HYDROcodone -acetaminophen  5-325 MG tablet Commonly known as: NORCO/VICODIN   hydrOXYzine  25 MG capsule Commonly known as: VISTARIL    ibuprofen  200 MG tablet Commonly known as: ADVIL    oxyCODONE -acetaminophen  5-325 MG tablet Commonly known as: PERCOCET/ROXICET       TAKE these medications      Indication  albuterol  108 (90 Base) MCG/ACT inhaler Commonly known as: VENTOLIN  HFA SMARTSIG:1-2 Puff(s) Via Inhaler PRN  Indication: Asthma   baclofen  10 MG tablet Commonly known as: LIORESAL  Take 1 tablet (10 mg total) by mouth 3 (three) times daily.  Indication: Muscle Spasm, Muscle Spasticity   CENTRUM ADULT PO Take 1 tablet by mouth daily.  Indication: supplement   hydrOXYzine  25 MG tablet Commonly known as: ATARAX  Take 1 tablet (25 mg total) by mouth 3 (three) times daily as needed for anxiety.  Indication: Feeling Anxious   naltrexone  50 MG tablet Commonly known as: DEPADE Take 0.5 tablets (25 mg total) by mouth at bedtime.  Indication: Abuse or Misuse of Alcohol   nicotine  21 mg/24hr patch Commonly known as: NICODERM CQ  - dosed in mg/24 hours Place 1 patch (21 mg total) onto the skin daily.  Indication: Nicotine  Addiction   sertraline  50 MG tablet Commonly known as: ZOLOFT  Take 1 tablet (50 mg total) by mouth daily.  Indication: anxiety disorder   zolpidem  10 MG  tablet Commonly known as: AMBIEN  Take 1 tablet (10 mg total) by mouth at bedtime as needed for sleep. What changed:  when to take this reasons to take this  Indication: Trouble Sleeping        Follow-up Information     [facility] [place] Follow up on 10/06/2024.   Why: You have an appointment on 10/06/24 at 5:00 pm for therapy services, Virtual.  You also have an appointment on 10/07/24 at 10:00 am for medication management services, Virtual. Contact information: [address], [place], [place] [address] Phone: [phone]                Follow-up recommendations:  {BHH DC FU RECOMMENDATIONS:22620}  Comments:  ***  Signed: Oliva DELENA Salmon, DO 09/26/2024, 11:19 AM           [1]  Social History Tobacco Use  Smoking Status Every Day   Current packs/day: 1.00   Average packs/day: 1 pack/day for 0.1 years (0.1 ttl pk-yrs)   Types: Cigarettes   Start date: 2026  Smokeless Tobacco Never   "
